# Patient Record
Sex: Male | Born: 1965 | Race: Black or African American | Hispanic: No | Marital: Married | Smoking: Current every day smoker
Health system: Southern US, Community
[De-identification: ages and names within clinical notes are randomized; demographics above are authoritative.]

## PROBLEM LIST (undated history)

## (undated) DIAGNOSIS — I10 Essential (primary) hypertension: Secondary | ICD-10-CM

## (undated) HISTORY — PX: HAND SURGERY: SHX662

---

## 2005-06-14 ENCOUNTER — Emergency Department (HOSPITAL_COMMUNITY): Admission: EM | Admit: 2005-06-14 | Discharge: 2005-06-14 | Payer: Self-pay | Admitting: Family Medicine

## 2009-03-19 ENCOUNTER — Emergency Department (HOSPITAL_COMMUNITY): Admission: EM | Admit: 2009-03-19 | Discharge: 2009-03-19 | Payer: Self-pay | Admitting: Family Medicine

## 2010-09-04 LAB — COMPREHENSIVE METABOLIC PANEL
BUN: 8 mg/dL (ref 6–23)
CO2: 29 mEq/L (ref 19–32)
Calcium: 9 mg/dL (ref 8.4–10.5)
Chloride: 101 mEq/L (ref 96–112)
Creatinine, Ser: 0.88 mg/dL (ref 0.4–1.5)
Glucose, Bld: 102 mg/dL — ABNORMAL HIGH (ref 70–99)
Potassium: 3.3 mEq/L — ABNORMAL LOW (ref 3.5–5.1)
Sodium: 138 mEq/L (ref 135–145)

## 2010-09-04 LAB — BRAIN NATRIURETIC PEPTIDE: Pro B Natriuretic peptide (BNP): <30 pg/mL (ref 0.0–100.0)

## 2013-10-04 ENCOUNTER — Emergency Department (INDEPENDENT_AMBULATORY_CARE_PROVIDER_SITE_OTHER)
Admission: EM | Admit: 2013-10-04 | Discharge: 2013-10-04 | Disposition: A | Discharge status: Home / Self Care | Payer: BC Managed Care – PPO | Source: Home / Self Care | Attending: Family Medicine | Admitting: Family Medicine

## 2013-10-04 ENCOUNTER — Encounter (HOSPITAL_COMMUNITY): Payer: Self-pay | Admitting: Emergency Medicine

## 2013-10-04 ENCOUNTER — Emergency Department (INDEPENDENT_AMBULATORY_CARE_PROVIDER_SITE_OTHER): Payer: BC Managed Care – PPO

## 2013-10-04 DIAGNOSIS — M25569 Pain in unspecified knee: Secondary | ICD-10-CM

## 2013-10-04 HISTORY — DX: Essential (primary) hypertension: I10

## 2013-10-04 MED ORDER — PREDNISONE 10 MG PO KIT: PACK | ORAL | Status: DC

## 2013-10-04 MED ORDER — TRAMADOL HCL 50 MG PO TABS: 50.0000 mg | ORAL_TABLET | Freq: Four times a day (QID) | ORAL | Status: DC | PRN

## 2013-10-04 NOTE — ED Provider Notes (Signed)
Kenneth Hart is a 48 y.o. male who presents to Urgent Care today for left knee and calf pain. Symptoms present for a few weeks but worsening over the past one or 2 days. Pain is worse with activity better with rest. No radiating pain weakness or numbness. Bowel bladder dysfunction. Pain with ambulation. No medications tried. Patient feels well otherwise. No chest pain palpitation shortness of breath or leg swelling.   Past Medical History  Diagnosis Date  . Hypertension    History  Substance Use Topics  . Smoking status: Current Every Day Smoker  . Smokeless tobacco: Not on file  . Alcohol Use: Yes   ROS as above Medications: No current facility-administered medications for this encounter.   Current Outpatient Prescriptions  Medication Sig Dispense Refill  . OVER THE COUNTER MEDICATION Blood pressure medicine      . PredniSONE 10 MG KIT 12 day dose pack po  1 kit  0  . traMADol (ULTRAM) 50 MG tablet Take 1 tablet (50 mg total) by mouth every 6 (six) hours as needed.  15 tablet  0    Exam:  BP 147/98  Pulse 100  Temp(Src) 98.5 F (36.9 C) (Oral)  Resp 20  SpO2 97% Gen: Well NAD HEENT: EOMI,  MMM Lungs: Normal work of breathing. CTABL Heart: RRR no MRG Abd: NABS, Soft. NT, ND Exts: Brisk capillary refill, warm and well perfused.  left knee: Moderate effusion. Pain with range of motion however range of motion is normal. No retropatellar crepitations on extension. Stable ligamentous exam. Negative McMurray's test.  Right knee: Normal-appearing no effusion full range of motion stable ligamentous exam.  Bilaterally extremities are nonedematous and with normal capillary refill.     No results found for this or any previous visit (from the past 24 hour(s)). Dg Knee Complete 4 Views Left  10/04/2013   CLINICAL DATA:  Right knee pain for 2 months.  No known injury.  EXAM: LEFT KNEE - COMPLETE 4+ VIEW  COMPARISON:  None.  FINDINGS: There is no fracture or dislocation. Joint spaces  are preserved. Small joint effusion is noted.  IMPRESSION: Small joint effusion.  Otherwise negative.   Electronically Signed   By: Inge Rise M.D.   On: 10/04/2013 14:53    Assessment and Plan: 48 y.o. male with pain and effusion. 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 Doubtful for DVT given no significant calf swelling. Plan to treat with tramadol and prednisone dosepak. Followup with Dr. Alfonso Ramus at [facility] not improving.  Discussed warning signs or symptoms. Please see discharge instructions. Patient expresses understanding.    Gregor Hams, MD 10/04/13 [phone]

## 2013-10-04 NOTE — Discharge Instructions (Signed)
Thank you for coming in today. Take prednisone daily for 12 days Use tramadol for pain control as needed [facility] not improving Knee Pain Knee pain can be a result of an injury or other medical conditions. Treatment will depend on the cause of your pain. HOME CARE  Only take medicine as told by your doctor.  Keep a healthy weight. Being overweight can make the knee hurt more.  Stretch before exercising or playing sports.  If there is constant knee pain, change the way you exercise. Ask your doctor for advice.  Make sure shoes fit well. Choose the right shoe for the sport or activity.  Protect your knees. Wear kneepads if needed.  Rest when you are tired. GET HELP RIGHT AWAY IF:   Your knee pain does not stop.  Your knee pain does not get better.  Your knee joint feels hot to the touch.  You have a fever. MAKE SURE YOU:   Understand these instructions.  Will watch this condition.  Will get help right away if you are not doing well or get worse. Document Released: 08/14/2008 Document Revised: 08/10/2011 Document Reviewed: 08/14/2008 [facility] Patient Information 2014 [place], [place].

## 2014-10-04 IMAGING — CR DG KNEE COMPLETE 4+V*L*
4 series · 4 of 4 positions shown · non-contrast
Comparison: None.

CLINICAL DATA: Right knee pain for 2 months.  No known injury.

EXAM:
LEFT KNEE - COMPLETE 4+ VIEW

[view not recorded (1 of 4)]
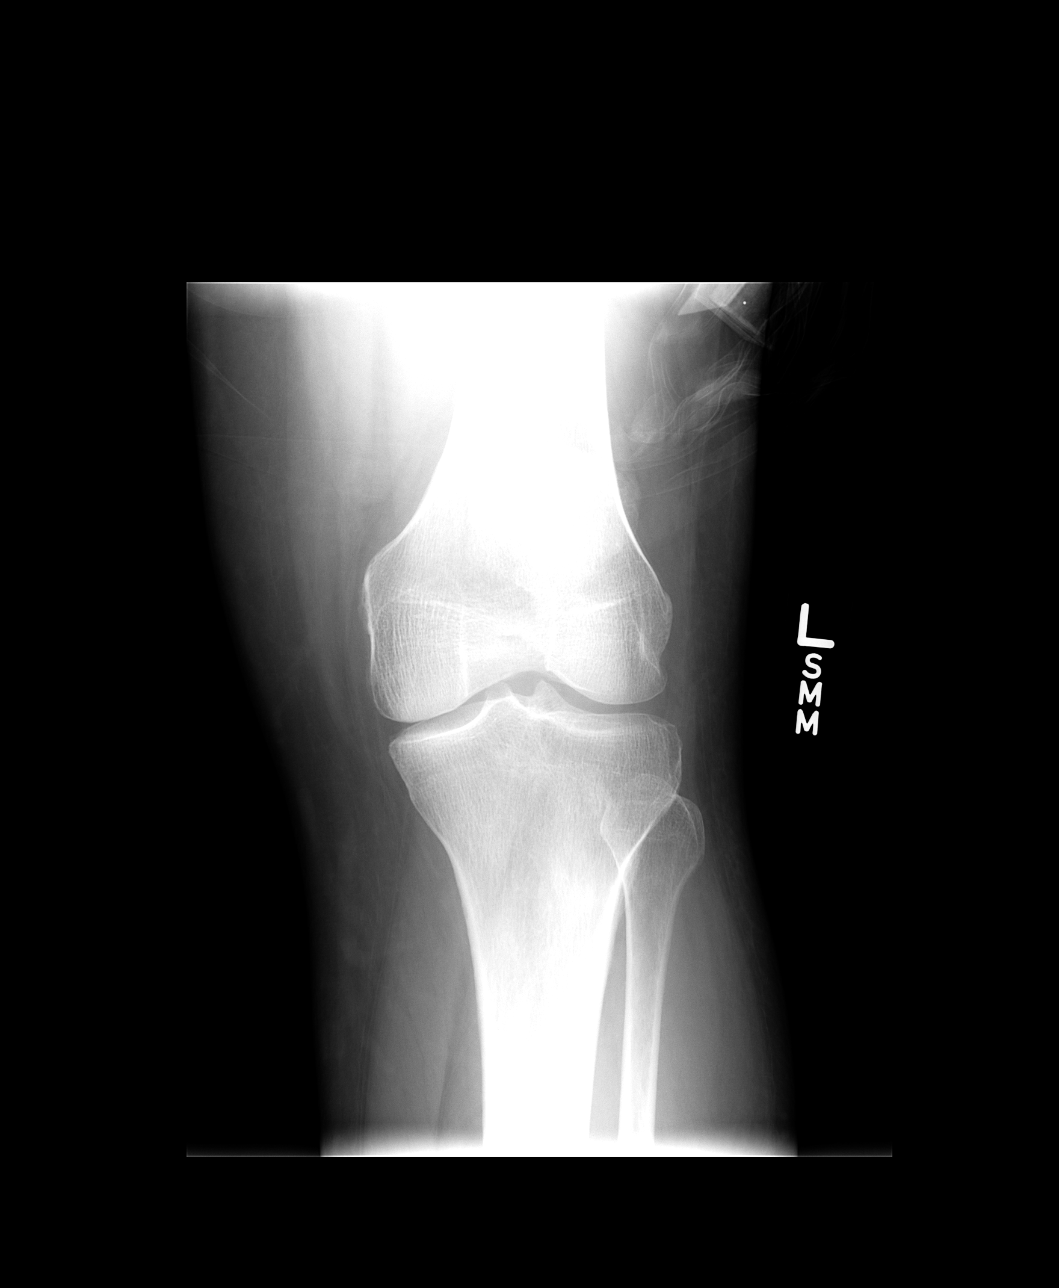

[view not recorded (2 of 4)]
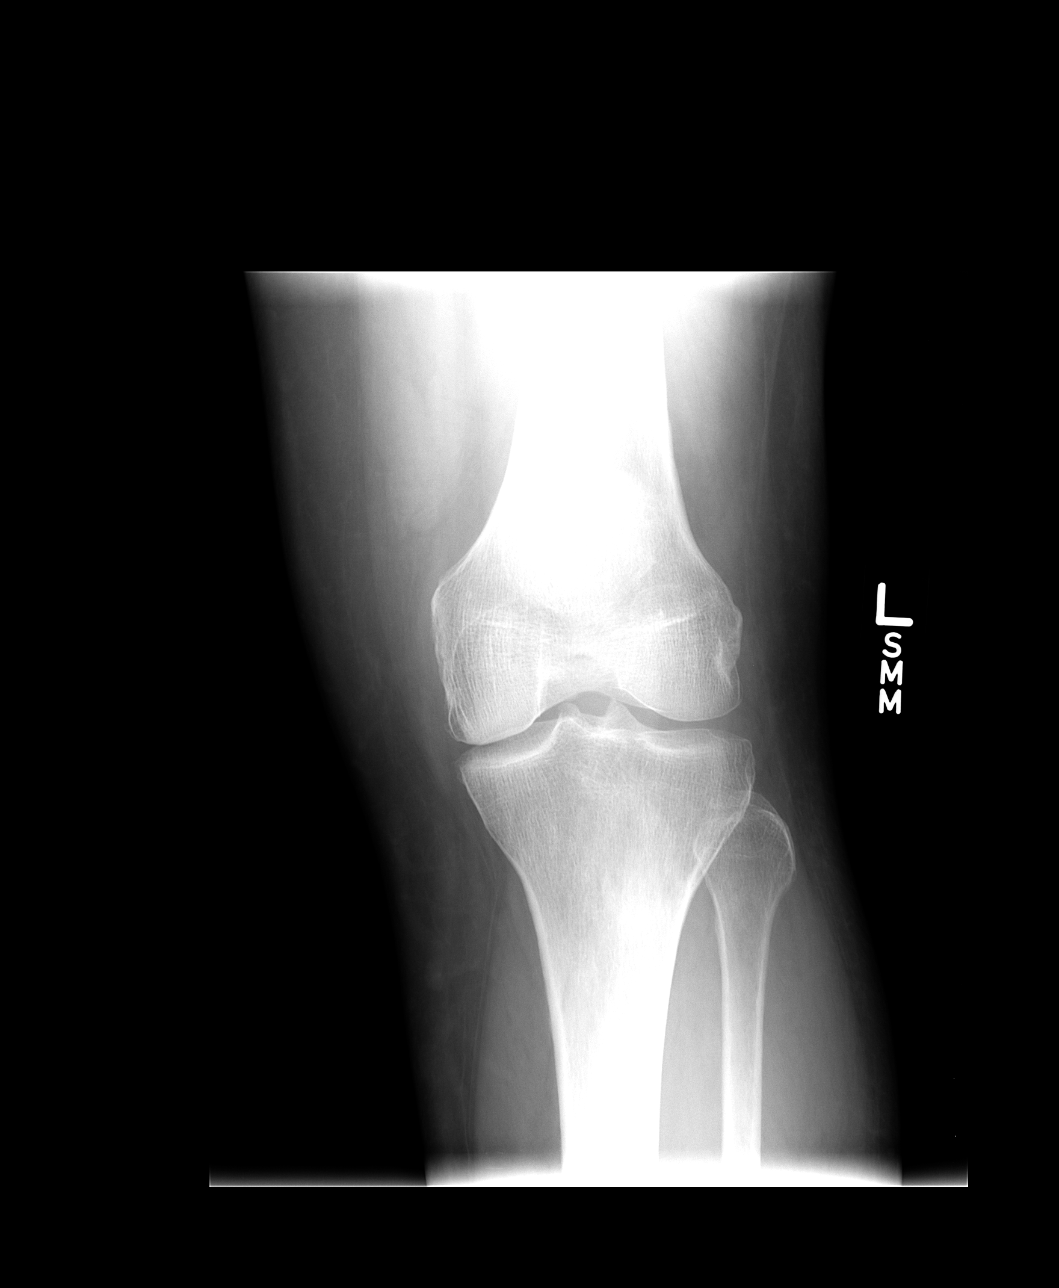

[view not recorded (3 of 4)]
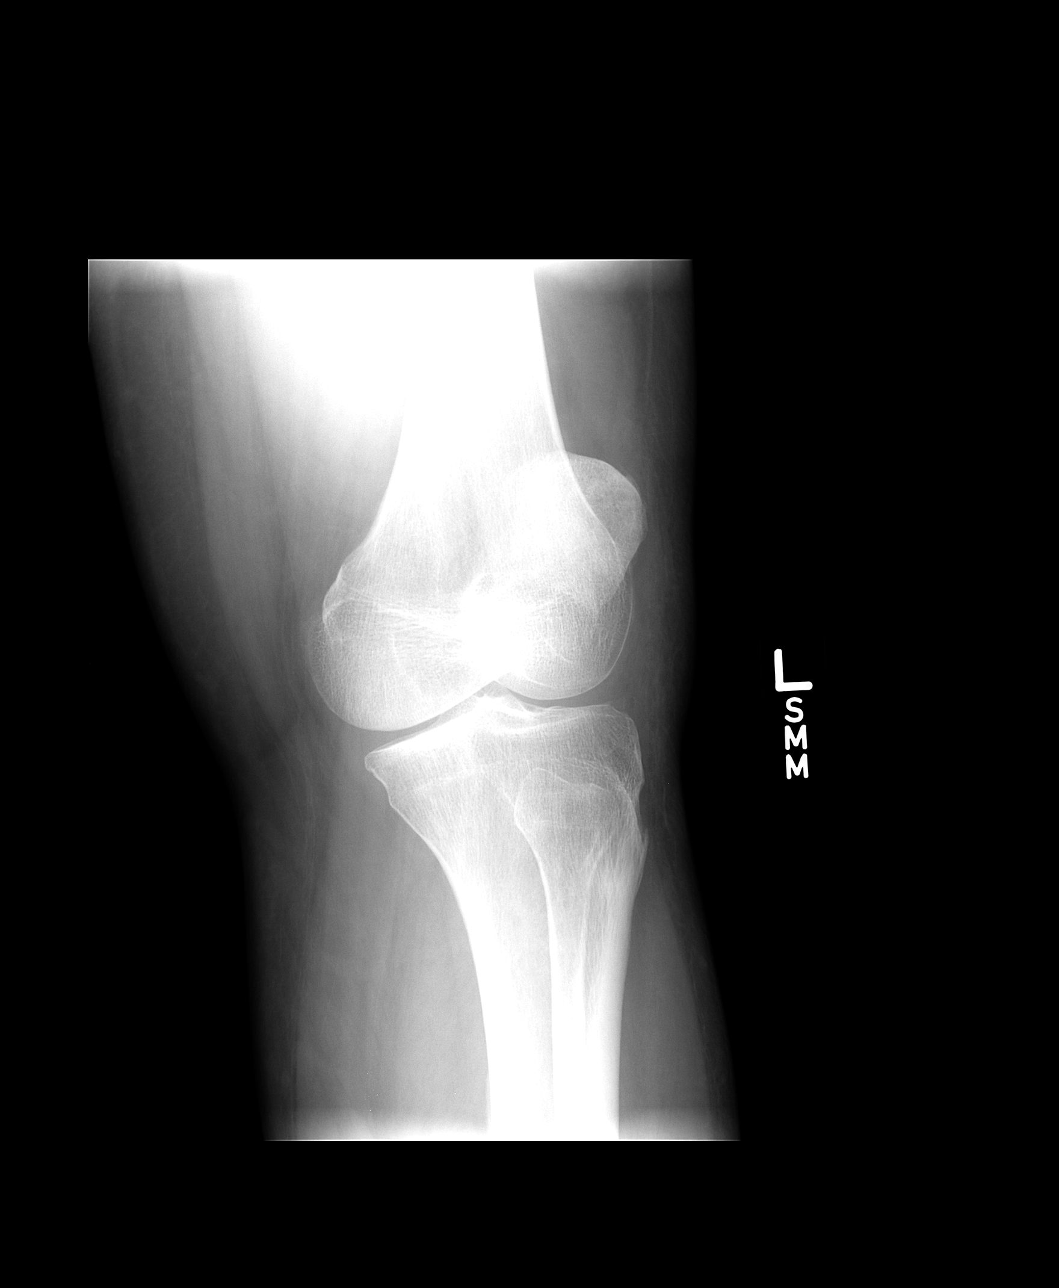

[view not recorded (4 of 4)]
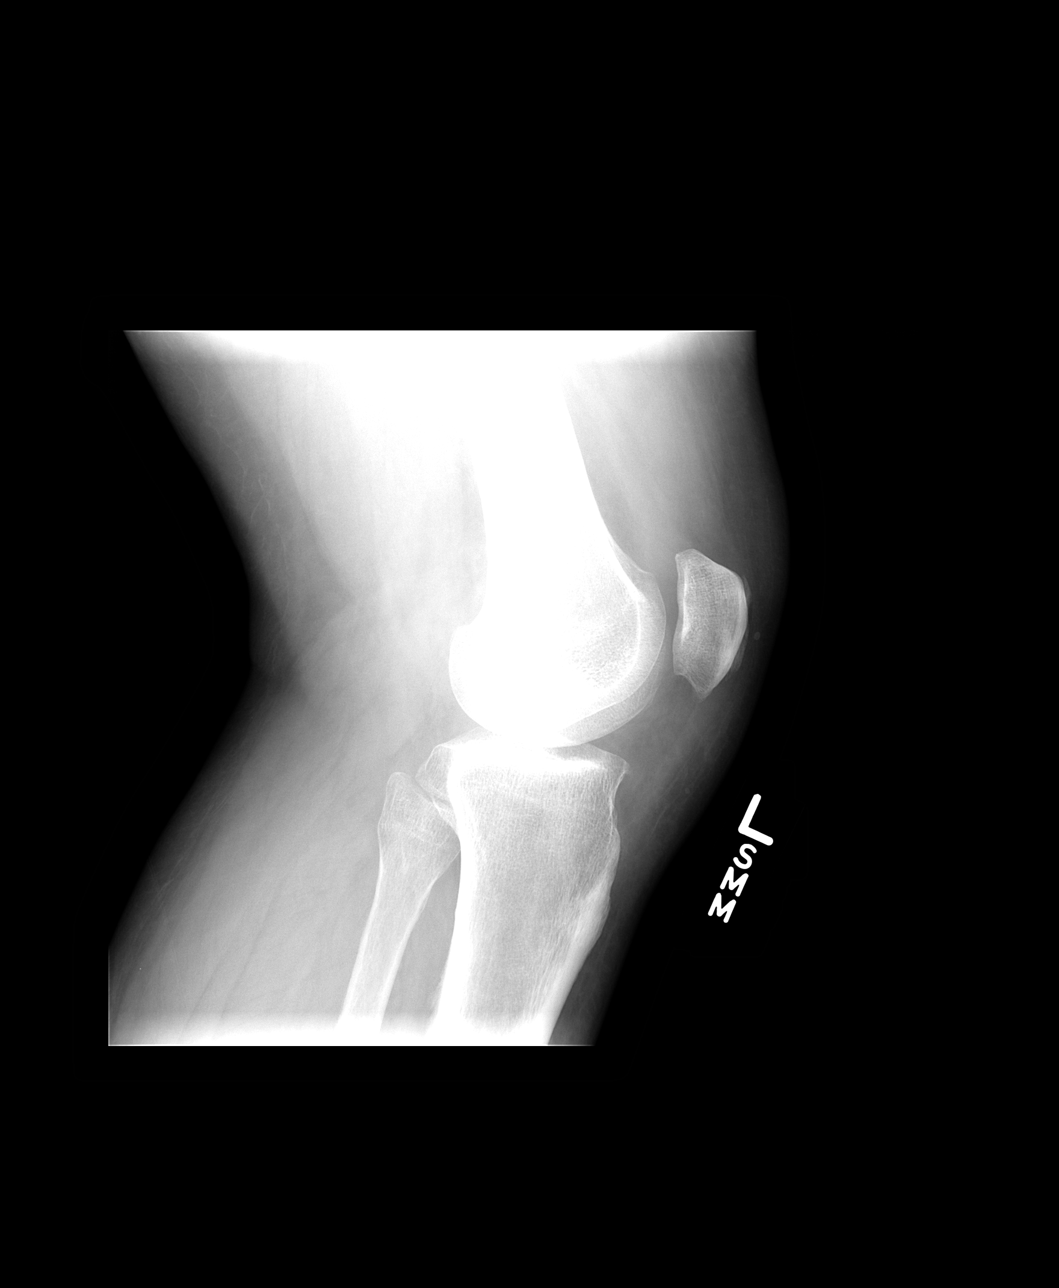

[4 of 4 positions shown; findings below may reference images not displayed]

FINDINGS: There is no fracture or dislocation. Joint spaces are preserved.
Small joint effusion is noted.
IMPRESSION: Small joint effusion.  Otherwise negative.

## 2024-04-10 ENCOUNTER — Inpatient Hospital Stay (HOSPITAL_COMMUNITY)
Admission: EM | Admit: 2024-04-10 | Discharge: 2024-04-15 | DRG: 291 | Disposition: A | Discharge status: Home / Self Care | Attending: Internal Medicine | Admitting: Internal Medicine

## 2024-04-10 ENCOUNTER — Encounter (HOSPITAL_COMMUNITY): Payer: Self-pay

## 2024-04-10 DIAGNOSIS — D649 Anemia, unspecified: Secondary | ICD-10-CM | POA: Diagnosis present

## 2024-04-10 DIAGNOSIS — I5021 Acute systolic (congestive) heart failure: Secondary | ICD-10-CM | POA: Diagnosis present

## 2024-04-10 DIAGNOSIS — E876 Hypokalemia: Secondary | ICD-10-CM | POA: Diagnosis present

## 2024-04-10 DIAGNOSIS — I5022 Chronic systolic (congestive) heart failure: Secondary | ICD-10-CM | POA: Diagnosis not present

## 2024-04-10 DIAGNOSIS — F17200 Nicotine dependence, unspecified, uncomplicated: Secondary | ICD-10-CM | POA: Diagnosis present

## 2024-04-10 DIAGNOSIS — R6881 Early satiety: Secondary | ICD-10-CM | POA: Diagnosis present

## 2024-04-10 DIAGNOSIS — R8271 Bacteriuria: Secondary | ICD-10-CM | POA: Diagnosis not present

## 2024-04-10 DIAGNOSIS — N19 Unspecified kidney failure: Secondary | ICD-10-CM | POA: Diagnosis not present

## 2024-04-10 DIAGNOSIS — E8779 Other fluid overload: Secondary | ICD-10-CM | POA: Diagnosis not present

## 2024-04-10 DIAGNOSIS — Z880 Allergy status to penicillin: Secondary | ICD-10-CM | POA: Diagnosis not present

## 2024-04-10 DIAGNOSIS — J811 Chronic pulmonary edema: Secondary | ICD-10-CM | POA: Diagnosis not present

## 2024-04-10 DIAGNOSIS — N2581 Secondary hyperparathyroidism of renal origin: Secondary | ICD-10-CM | POA: Diagnosis present

## 2024-04-10 DIAGNOSIS — Z131 Encounter for screening for diabetes mellitus: Secondary | ICD-10-CM | POA: Diagnosis not present

## 2024-04-10 DIAGNOSIS — Z89111 Acquired absence of right hand: Secondary | ICD-10-CM | POA: Diagnosis not present

## 2024-04-10 DIAGNOSIS — N390 Urinary tract infection, site not specified: Secondary | ICD-10-CM | POA: Diagnosis present

## 2024-04-10 DIAGNOSIS — I10 Essential (primary) hypertension: Secondary | ICD-10-CM | POA: Diagnosis not present

## 2024-04-10 DIAGNOSIS — I472 Ventricular tachycardia, unspecified: Secondary | ICD-10-CM | POA: Diagnosis present

## 2024-04-10 DIAGNOSIS — I509 Heart failure, unspecified: Secondary | ICD-10-CM | POA: Diagnosis not present

## 2024-04-10 DIAGNOSIS — Z79899 Other long term (current) drug therapy: Secondary | ICD-10-CM

## 2024-04-10 DIAGNOSIS — I5031 Acute diastolic (congestive) heart failure: Secondary | ICD-10-CM | POA: Diagnosis not present

## 2024-04-10 DIAGNOSIS — I132 Hypertensive heart and chronic kidney disease with heart failure and with stage 5 chronic kidney disease, or end stage renal disease: Secondary | ICD-10-CM | POA: Diagnosis present

## 2024-04-10 DIAGNOSIS — D631 Anemia in chronic kidney disease: Secondary | ICD-10-CM | POA: Diagnosis present

## 2024-04-10 DIAGNOSIS — E66812 Obesity, class 2: Secondary | ICD-10-CM | POA: Diagnosis present

## 2024-04-10 DIAGNOSIS — N185 Chronic kidney disease, stage 5: Secondary | ICD-10-CM | POA: Diagnosis present

## 2024-04-10 DIAGNOSIS — I429 Cardiomyopathy, unspecified: Secondary | ICD-10-CM | POA: Diagnosis not present

## 2024-04-10 DIAGNOSIS — M79672 Pain in left foot: Secondary | ICD-10-CM | POA: Diagnosis not present

## 2024-04-10 DIAGNOSIS — Z6838 Body mass index (BMI) 38.0-38.9, adult: Secondary | ICD-10-CM

## 2024-04-10 DIAGNOSIS — I120 Hypertensive chronic kidney disease with stage 5 chronic kidney disease or end stage renal disease: Secondary | ICD-10-CM | POA: Diagnosis not present

## 2024-04-10 DIAGNOSIS — R9431 Abnormal electrocardiogram [ECG] [EKG]: Secondary | ICD-10-CM | POA: Diagnosis not present

## 2024-04-10 DIAGNOSIS — J929 Pleural plaque without asbestos: Secondary | ICD-10-CM | POA: Diagnosis not present

## 2024-04-10 DIAGNOSIS — N179 Acute kidney failure, unspecified: Secondary | ICD-10-CM | POA: Diagnosis not present

## 2024-04-10 DIAGNOSIS — J90 Pleural effusion, not elsewhere classified: Secondary | ICD-10-CM | POA: Diagnosis not present

## 2024-04-10 DIAGNOSIS — R109 Unspecified abdominal pain: Secondary | ICD-10-CM | POA: Diagnosis not present

## 2024-04-10 LAB — CBC
HCT: 30.9 % — ABNORMAL LOW (ref 39.0–52.0)
Hemoglobin: 9 g/dL — ABNORMAL LOW (ref 13.0–17.0)
MCH: 24.7 pg — ABNORMAL LOW (ref 26.0–34.0)
MCHC: 29.1 g/dL — ABNORMAL LOW (ref 30.0–36.0)
MCV: 84.7 fL (ref 80.0–100.0)
Platelets: 336 K/uL (ref 150–400)
RBC: 3.65 MIL/uL — ABNORMAL LOW (ref 4.22–5.81)
RDW: 19.1 % — ABNORMAL HIGH (ref 11.5–15.5)
WBC: 13.6 K/uL — ABNORMAL HIGH (ref 4.0–10.5)
nRBC: 0 % (ref 0.0–0.2)

## 2024-04-10 NOTE — ED Triage Notes (Signed)
 Nurse First Note: Pt had labs done by the FNP at his job and he was told to come to the ED for abnormal kidney function.

## 2024-04-11 ENCOUNTER — Other Ambulatory Visit: Payer: Self-pay

## 2024-04-11 ENCOUNTER — Emergency Department (HOSPITAL_COMMUNITY)

## 2024-04-11 ENCOUNTER — Observation Stay (HOSPITAL_COMMUNITY)

## 2024-04-11 ENCOUNTER — Encounter (HOSPITAL_COMMUNITY): Payer: Self-pay | Admitting: Family Medicine

## 2024-04-11 DIAGNOSIS — Z880 Allergy status to penicillin: Secondary | ICD-10-CM | POA: Diagnosis not present

## 2024-04-11 DIAGNOSIS — J811 Chronic pulmonary edema: Secondary | ICD-10-CM | POA: Diagnosis not present

## 2024-04-11 DIAGNOSIS — R8271 Bacteriuria: Secondary | ICD-10-CM | POA: Diagnosis present

## 2024-04-11 DIAGNOSIS — E876 Hypokalemia: Secondary | ICD-10-CM | POA: Diagnosis present

## 2024-04-11 DIAGNOSIS — I5031 Acute diastolic (congestive) heart failure: Secondary | ICD-10-CM | POA: Diagnosis not present

## 2024-04-11 DIAGNOSIS — J929 Pleural plaque without asbestos: Secondary | ICD-10-CM | POA: Diagnosis not present

## 2024-04-11 DIAGNOSIS — R9431 Abnormal electrocardiogram [ECG] [EKG]: Secondary | ICD-10-CM | POA: Diagnosis not present

## 2024-04-11 DIAGNOSIS — J90 Pleural effusion, not elsewhere classified: Secondary | ICD-10-CM | POA: Diagnosis not present

## 2024-04-11 DIAGNOSIS — I5022 Chronic systolic (congestive) heart failure: Secondary | ICD-10-CM | POA: Diagnosis not present

## 2024-04-11 DIAGNOSIS — Z79899 Other long term (current) drug therapy: Secondary | ICD-10-CM | POA: Diagnosis not present

## 2024-04-11 DIAGNOSIS — I472 Ventricular tachycardia, unspecified: Secondary | ICD-10-CM | POA: Diagnosis present

## 2024-04-11 DIAGNOSIS — D649 Anemia, unspecified: Secondary | ICD-10-CM | POA: Diagnosis not present

## 2024-04-11 DIAGNOSIS — E66812 Obesity, class 2: Secondary | ICD-10-CM | POA: Diagnosis present

## 2024-04-11 DIAGNOSIS — N185 Chronic kidney disease, stage 5: Secondary | ICD-10-CM | POA: Diagnosis present

## 2024-04-11 DIAGNOSIS — I429 Cardiomyopathy, unspecified: Secondary | ICD-10-CM | POA: Diagnosis not present

## 2024-04-11 DIAGNOSIS — N19 Unspecified kidney failure: Secondary | ICD-10-CM | POA: Diagnosis present

## 2024-04-11 DIAGNOSIS — R109 Unspecified abdominal pain: Secondary | ICD-10-CM | POA: Diagnosis not present

## 2024-04-11 DIAGNOSIS — I5021 Acute systolic (congestive) heart failure: Secondary | ICD-10-CM

## 2024-04-11 DIAGNOSIS — N2581 Secondary hyperparathyroidism of renal origin: Secondary | ICD-10-CM | POA: Diagnosis present

## 2024-04-11 DIAGNOSIS — F17200 Nicotine dependence, unspecified, uncomplicated: Secondary | ICD-10-CM | POA: Diagnosis present

## 2024-04-11 DIAGNOSIS — N390 Urinary tract infection, site not specified: Secondary | ICD-10-CM | POA: Diagnosis present

## 2024-04-11 DIAGNOSIS — Z6838 Body mass index (BMI) 38.0-38.9, adult: Secondary | ICD-10-CM | POA: Diagnosis not present

## 2024-04-11 DIAGNOSIS — R6881 Early satiety: Secondary | ICD-10-CM | POA: Diagnosis present

## 2024-04-11 DIAGNOSIS — D631 Anemia in chronic kidney disease: Secondary | ICD-10-CM | POA: Diagnosis present

## 2024-04-11 DIAGNOSIS — I509 Heart failure, unspecified: Secondary | ICD-10-CM | POA: Diagnosis not present

## 2024-04-11 DIAGNOSIS — I132 Hypertensive heart and chronic kidney disease with heart failure and with stage 5 chronic kidney disease, or end stage renal disease: Secondary | ICD-10-CM | POA: Diagnosis present

## 2024-04-11 DIAGNOSIS — I5041 Acute combined systolic (congestive) and diastolic (congestive) heart failure: Secondary | ICD-10-CM

## 2024-04-11 DIAGNOSIS — Z89111 Acquired absence of right hand: Secondary | ICD-10-CM | POA: Diagnosis not present

## 2024-04-11 LAB — BASIC METABOLIC PANEL WITH GFR
Anion gap: 10 (ref 5–15)
BUN: 47 mg/dL — ABNORMAL HIGH (ref 6–20)
CO2: 22 mmol/L (ref 22–32)
Calcium: 4.9 mg/dL — CL (ref 8.9–10.3)
Chloride: 107 mmol/L (ref 98–111)
Creatinine, Ser: 5.63 mg/dL — ABNORMAL HIGH (ref 0.61–1.24)
GFR, Estimated: 11 mL/min — ABNORMAL LOW (ref >60)
Glucose, Bld: 88 mg/dL (ref 70–99)
Potassium: 3.4 mmol/L — ABNORMAL LOW (ref 3.5–5.1)
Sodium: 139 mmol/L (ref 135–145)

## 2024-04-11 LAB — COMPREHENSIVE METABOLIC PANEL WITH GFR
ALT: 27 U/L (ref 0–44)
AST: 20 U/L (ref 15–41)
Albumin: 2.7 g/dL — ABNORMAL LOW (ref 3.5–5.0)
Alkaline Phosphatase: 88 U/L (ref 38–126)
Anion gap: 16 — ABNORMAL HIGH (ref 5–15)
BUN: 45 mg/dL — ABNORMAL HIGH (ref 6–20)
CO2: 23 mmol/L (ref 22–32)
Calcium: 5 mg/dL — CL (ref 8.9–10.3)
Chloride: 103 mmol/L (ref 98–111)
Creatinine, Ser: 5.73 mg/dL — ABNORMAL HIGH (ref 0.61–1.24)
GFR, Estimated: 11 mL/min — ABNORMAL LOW (ref >60)
Glucose, Bld: 132 mg/dL — ABNORMAL HIGH (ref 70–99)
Potassium: 3.3 mmol/L — ABNORMAL LOW (ref 3.5–5.1)
Sodium: 142 mmol/L (ref 135–145)
Total Bilirubin: 0.2 mg/dL (ref 0.0–1.2)
Total Protein: 6.2 g/dL — ABNORMAL LOW (ref 6.5–8.1)

## 2024-04-11 LAB — ECHOCARDIOGRAM COMPLETE
Area-P 1/2: 3.85 cm2
Calc EF: 43 %
S' Lateral: 4.2 cm
Single Plane A2C EF: 41.2 %
Single Plane A4C EF: 43.5 %

## 2024-04-11 LAB — CBC
HCT: 29.3 % — ABNORMAL LOW (ref 39.0–52.0)
Hemoglobin: 8.5 g/dL — ABNORMAL LOW (ref 13.0–17.0)
MCH: 24.5 pg — ABNORMAL LOW (ref 26.0–34.0)
MCHC: 29 g/dL — ABNORMAL LOW (ref 30.0–36.0)
MCV: 84.4 fL (ref 80.0–100.0)
Platelets: 309 K/uL (ref 150–400)
RBC: 3.47 MIL/uL — ABNORMAL LOW (ref 4.22–5.81)
RDW: 19.1 % — ABNORMAL HIGH (ref 11.5–15.5)
WBC: 11 K/uL — ABNORMAL HIGH (ref 4.0–10.5)
nRBC: 0 % (ref 0.0–0.2)

## 2024-04-11 LAB — HEPATITIS C ANTIBODY: HCV Ab: NONREACTIVE

## 2024-04-11 LAB — HEMOGLOBIN A1C
Hgb A1c MFr Bld: 5.8 % — ABNORMAL HIGH (ref 4.8–5.6)
Mean Plasma Glucose: 119.76 mg/dL

## 2024-04-11 LAB — I-STAT CHEM 8, ED
BUN: 47 mg/dL — ABNORMAL HIGH (ref 6–20)
Calcium, Ion: 0.65 mmol/L — CL (ref 1.15–1.40)
Chloride: 107 mmol/L (ref 98–111)
Creatinine, Ser: 6 mg/dL — ABNORMAL HIGH (ref 0.61–1.24)
Glucose, Bld: 104 mg/dL — ABNORMAL HIGH (ref 70–99)
HCT: 31 % — ABNORMAL LOW (ref 39.0–52.0)
Hemoglobin: 10.5 g/dL — ABNORMAL LOW (ref 13.0–17.0)
Potassium: 3.3 mmol/L — ABNORMAL LOW (ref 3.5–5.1)
Sodium: 142 mmol/L (ref 135–145)
TCO2: 21 mmol/L — ABNORMAL LOW (ref 22–32)

## 2024-04-11 LAB — MAGNESIUM
Magnesium: 1.3 mg/dL — ABNORMAL LOW (ref 1.7–2.4)
Magnesium: 1.3 mg/dL — ABNORMAL LOW (ref 1.7–2.4)

## 2024-04-11 LAB — URINALYSIS, ROUTINE W REFLEX MICROSCOPIC
Bilirubin Urine: NEGATIVE
Glucose, UA: NEGATIVE mg/dL
Ketones, ur: NEGATIVE mg/dL
Nitrite: NEGATIVE
Protein, ur: 100 mg/dL — AB
Specific Gravity, Urine: 1.01 (ref 1.005–1.030)
WBC, UA: >50 WBC/hpf (ref 0–5)
pH: 5 (ref 5.0–8.0)

## 2024-04-11 LAB — CREATININE, URINE, RANDOM: Creatinine, Urine: 86 mg/dL

## 2024-04-11 LAB — PROTEIN / CREATININE RATIO, URINE
Creatinine, Urine: 86 mg/dL
Protein Creatinine Ratio: 0.95 mg/mg{creat} — ABNORMAL HIGH (ref 0.00–0.15)
Total Protein, Urine: 82 mg/dL

## 2024-04-11 LAB — FERRITIN: Ferritin: 65 ng/mL (ref 24–336)

## 2024-04-11 LAB — TSH: TSH: 1.283 u[IU]/mL (ref 0.350–4.500)

## 2024-04-11 LAB — FOLATE: Folate: 7 ng/mL (ref >5.9)

## 2024-04-11 LAB — IRON AND TIBC
Iron: 23 ug/dL — ABNORMAL LOW (ref 45–182)
Saturation Ratios: 9 % — ABNORMAL LOW (ref 17.9–39.5)
TIBC: 272 ug/dL (ref 250–450)
UIBC: 249 ug/dL

## 2024-04-11 LAB — RETICULOCYTES
Immature Retic Fract: 14.6 % (ref 2.3–15.9)
RBC.: 3.46 MIL/uL — ABNORMAL LOW (ref 4.22–5.81)
Retic Count, Absolute: 35.3 K/uL (ref 19.0–186.0)
Retic Ct Pct: 1 % (ref 0.4–3.1)

## 2024-04-11 LAB — HIV ANTIBODY (ROUTINE TESTING W REFLEX): HIV Screen 4th Generation wRfx: NONREACTIVE

## 2024-04-11 LAB — PHOSPHORUS: Phosphorus: 5.5 mg/dL — ABNORMAL HIGH (ref 2.5–4.6)

## 2024-04-11 LAB — SODIUM, URINE, RANDOM: Sodium, Ur: 34 mmol/L

## 2024-04-11 LAB — PROTEIN, URINE, RANDOM: Total Protein, Urine: 83 mg/dL

## 2024-04-11 LAB — URIC ACID: Uric Acid, Serum: 9 mg/dL — ABNORMAL HIGH (ref 3.7–8.6)

## 2024-04-11 LAB — BRAIN NATRIURETIC PEPTIDE: B Natriuretic Peptide: 1608.5 pg/mL — ABNORMAL HIGH (ref 0.0–100.0)

## 2024-04-11 LAB — CK: Total CK: 472 U/L — ABNORMAL HIGH (ref 49–397)

## 2024-04-11 LAB — VITAMIN B12: Vitamin B-12: 372 pg/mL (ref 180–914)

## 2024-04-11 LAB — HEPATITIS B SURFACE ANTIGEN: Hepatitis B Surface Ag: NONREACTIVE

## 2024-04-11 MED ORDER — POTASSIUM CHLORIDE CRYS ER 10 MEQ PO TBCR
10.0000 meq | EXTENDED_RELEASE_TABLET | Freq: Once | ORAL | Status: AC
  Administered 2024-04-11: 10 meq via ORAL
  Filled 2024-04-11: qty 1

## 2024-04-11 MED ORDER — SODIUM CHLORIDE 0.9 % IV BOLUS
500.0000 mL | Freq: Once | INTRAVENOUS | Status: AC
  Administered 2024-04-11: 500 mL via INTRAVENOUS

## 2024-04-11 MED ORDER — LACTATED RINGERS IV SOLN: INTRAVENOUS | Status: DC

## 2024-04-11 MED ORDER — SODIUM CHLORIDE 0.9 % IV SOLN
1.0000 g | Freq: Once | INTRAVENOUS | Status: AC
  Administered 2024-04-11: 1 g via INTRAVENOUS
  Filled 2024-04-11: qty 10

## 2024-04-11 MED ORDER — SENNA 8.6 MG PO TABS: 1.0000 | ORAL_TABLET | Freq: Every day | ORAL | Status: DC | PRN

## 2024-04-11 MED ORDER — SODIUM CHLORIDE 0.9 % IV SOLN: INTRAVENOUS | Status: DC

## 2024-04-11 MED ORDER — CALCIUM GLUCONATE-NACL 2-0.675 GM/100ML-% IV SOLN
2.0000 g | Freq: Once | INTRAVENOUS | Status: AC
  Administered 2024-04-11: 2000 mg via INTRAVENOUS
  Filled 2024-04-11: qty 100

## 2024-04-11 MED ORDER — IRON SUCROSE 200 MG IVPB - SIMPLE MED
200.0000 mg | Status: DC
  Administered 2024-04-11: 200 mg via INTRAVENOUS
  Filled 2024-04-11 (×2): qty 110

## 2024-04-11 MED ORDER — OXYCODONE HCL 5 MG PO TABS
5.0000 mg | ORAL_TABLET | ORAL | Status: DC | PRN
  Filled 2024-04-11: qty 1

## 2024-04-11 MED ORDER — MAGNESIUM CHLORIDE 64 MG PO TBEC
1.0000 | DELAYED_RELEASE_TABLET | Freq: Once | ORAL | Status: AC
  Administered 2024-04-11: 64 mg via ORAL
  Filled 2024-04-11: qty 1

## 2024-04-11 MED ORDER — CALCIUM GLUCONATE-NACL 1-0.675 GM/50ML-% IV SOLN
1.0000 g | Freq: Once | INTRAVENOUS | Status: AC
  Administered 2024-04-11: 1000 mg via INTRAVENOUS
  Filled 2024-04-11: qty 50

## 2024-04-11 MED ORDER — TRIMETHOBENZAMIDE HCL 100 MG/ML IM SOLN: 200.0000 mg | Freq: Four times a day (QID) | INTRAMUSCULAR | Status: DC | PRN

## 2024-04-11 MED ORDER — FENTANYL CITRATE (PF) 50 MCG/ML IJ SOSY: 12.5000 ug | PREFILLED_SYRINGE | INTRAMUSCULAR | Status: DC | PRN

## 2024-04-11 MED ORDER — SODIUM CHLORIDE 0.9% FLUSH
3.0000 mL | Freq: Two times a day (BID) | INTRAVENOUS | Status: DC
  Administered 2024-04-11 – 2024-04-15 (×10): 3 mL via INTRAVENOUS

## 2024-04-11 MED ORDER — MAGNESIUM SULFATE IN D5W 1-5 GM/100ML-% IV SOLN
1.0000 g | Freq: Once | INTRAVENOUS | Status: AC
  Administered 2024-04-11: 1 g via INTRAVENOUS
  Filled 2024-04-11: qty 100

## 2024-04-11 MED ORDER — METOPROLOL SUCCINATE ER 25 MG PO TB24
25.0000 mg | ORAL_TABLET | Freq: Every day | ORAL | Status: DC
  Administered 2024-04-11 – 2024-04-12 (×2): 25 mg via ORAL
  Filled 2024-04-11 (×2): qty 1

## 2024-04-11 MED ORDER — FUROSEMIDE 10 MG/ML IJ SOLN
80.0000 mg | Freq: Two times a day (BID) | INTRAMUSCULAR | Status: DC
  Administered 2024-04-11 – 2024-04-13 (×4): 80 mg via INTRAVENOUS
  Filled 2024-04-11 (×4): qty 8

## 2024-04-11 MED ORDER — ACETAMINOPHEN 325 MG PO TABS: 650.0000 mg | ORAL_TABLET | Freq: Four times a day (QID) | ORAL | Status: DC | PRN

## 2024-04-11 MED ORDER — ACETAMINOPHEN 650 MG RE SUPP: 650.0000 mg | Freq: Four times a day (QID) | RECTAL | Status: DC | PRN

## 2024-04-11 MED ORDER — HEPARIN SODIUM (PORCINE) 5000 UNIT/ML IJ SOLN
5000.0000 [IU] | Freq: Three times a day (TID) | INTRAMUSCULAR | Status: DC
  Administered 2024-04-11 – 2024-04-15 (×14): 5000 [IU] via SUBCUTANEOUS
  Filled 2024-04-11 (×14): qty 1

## 2024-04-11 NOTE — Consult Note (Addendum)
 Cardiology Consultation   Patient ID: Kenneth Hart MRN: [id number]; DOB: 21-Feb-1966  Admit date: 04/10/2024 Date of Consult: 04/11/2024  PCP:  Freddrick, No   [facility] Providers Cardiologist:  None        Patient Profile: Kenneth Hart is a 58 y.o. male with a hx of hypertension, though has not been seen by a medical provider in over 10 years who is being seen 04/11/2024 for the evaluation of heart failure exacerbation at the request of Concepcion Riser MD.  History of Present Illness: Mr. Mach no prior cardiac history.  Presented to the ED 11/10 at the request of his PCP at his job after he had presented for left foot pain given concern for gout routine labs were drawn and noted abnormal kidney function. BP 126/80   HR 91 ECG: sinus rhythm with TWI in leads I, aVL, II, V4-V6 VR 93 [TWI new when compared to 2010] CT chest showed evidence of heart failure with interstitial and alveolar edema and trace left pleural effusion CT renal stone showed no evidence of stone or hydronephrosis.  Third spacing and the findings above.  Echocardiogram showed LVEF 40 to 45% with RWMA [hypokinesis to the basal mid inferior septal wall and inferior wall and inferior lateral wall].  Mild concentric LVH with mild dilation to LV.  D3 DD.  Reduced RV function.  RV moderately enlarged.  Moderately dilated LA.  Pertinent lab work K3.3    Cr ~6   Ca 5.0 with calcium ionized 0.65 Magnesium 1.3     Phosphorus 5.5 BNP 1608    Albumin 2.7 CK 472 Hemoglobin ~9  Patient has received electrolyte repletion, antibiotics, and IV fluids.  Nephrology has been consulted. They have ordered IV lasix to manage volume. Patient has been advised that HD is likely.   On interview, patient confirms he has no prior cardiac history.  Denies chest pain, orthopnea, and PND.  Did note that he has been having some dyspnea on exertion/shortness of breath this week. Denied recent illness.  Has noticed BLE off  and on for years.  Drinks alcohol only occasionally, 2-3 beers at max a week Uses tobacco products, smokes 1/3 a pack a day for many years Denied illicit drug use.    Past Medical History:  Diagnosis Date   Hypertension     Past Surgical History:  Procedure Laterality Date   HAND SURGERY     amputated and reattached       Scheduled Meds:  furosemide  80 mg Intravenous BID   heparin  5,000 Units Subcutaneous Q8H   sodium chloride flush  3 mL Intravenous Q12H   Continuous Infusions:  iron sucrose     PRN Meds: acetaminophen **OR** acetaminophen, fentaNYL (SUBLIMAZE) injection, oxyCODONE, senna, trimethobenzamide  Allergies:    Allergies  Allergen Reactions   Amoxicillin Hives    Social History:   Social History   Socioeconomic History   Marital status: Married    Spouse name: Not on file   Number of children: Not on file   Years of education: Not on file   Highest education level: Not on file  Occupational History   Not on file  Tobacco Use   Smoking status: Every Day   Smokeless tobacco: Not on file  Substance and Sexual Activity   Alcohol use: Yes   Drug use: Not on file   Sexual activity: Not on file  Other Topics Concern   Not on file  Social History Narrative  Not on file   Social Drivers of Health   Financial Resource Strain: Not on file  Food Insecurity: Not on file  Transportation Needs: Not on file  Physical Activity: Not on file  Stress: Not on file  Social Connections: Not on file  Intimate Partner Violence: Not on file    Family History:   History reviewed. No pertinent family history.   ROS:  Please see the history of present illness.  All other ROS reviewed and negative.     Physical Exam/Data: Vitals:   04/11/24 0800 04/11/24 0905 04/11/24 1000 04/11/24 1346  BP: (!) 146/88  (!) 137/92 (!) 167/104  Pulse: 96 86 96 97  Resp: 10 14 (!) 22 18  Temp:  98.7 F (37.1 C)  98.7 F (37.1 C)  TempSrc:  Oral  Oral  SpO2: 97% 96%  95% 98%    Intake/Output Summary (Last 24 hours) at 04/11/2024 1448 Last data filed at 04/11/2024 0730 Gross per 24 hour  Intake 550 ml  Output 320 ml  Net 230 ml       No data to display           There is no height or weight on file to calculate BMI.  General:  Well nourished, well developed, in no acute distress HEENT: normal Neck: unable to assess JVD Vascular: left radial pulses 2+ , right difficult to assess though known trauma hand surgery hx Cardiac:  normal S1, S2; RRR; no murmur  Lungs:  Bibasilar crackles and occasional wheezing Ext: 1+ pitting edema Musculoskeletal:  No deformities, BUE and BLE strength normal and equal Skin: warm and dry  Neuro:  CNs 2-12 intact, no focal abnormalities noted Psych:  Normal affect   EKG:  The EKG was personally reviewed and demonstrates:  See hpi Telemetry:  Telemetry was personally reviewed and demonstrates:  sinus rhythm HR ~90, one episode of slow VT around 10:30 am today  Laboratory Data:  Chemistry Recent Labs  Lab 04/10/24 2305 04/11/24 0058 04/11/24 0106 04/11/24 0350  NA 142  --  142 139  K 3.3*  --  3.3* 3.4*  CL 103  --  107 107  CO2 23  --   --  22  GLUCOSE 132*  --  104* 88  BUN 45*  --  47* 47*  CREATININE 5.73*  --  6.00* 5.63*  CALCIUM 5.0*  --   --  4.9*  MG  --  1.3*  --  1.3*  GFRNONAA 11*  --   --  11*  ANIONGAP 16*  --   --  10    Recent Labs  Lab 04/10/24 2305  PROT 6.2*  ALBUMIN 2.7*  AST 20  ALT 27  ALKPHOS 88  BILITOT 0.2   Hematology Recent Labs  Lab 04/10/24 2305 04/11/24 0106 04/11/24 0350  WBC 13.6*  --  11.0*  RBC 3.65*  --  3.47*  3.46*  HGB 9.0* 10.5* 8.5*  HCT 30.9* 31.0* 29.3*  MCV 84.7  --  84.4  MCH 24.7*  --  24.5*  MCHC 29.1*  --  29.0*  RDW 19.1*  --  19.1*  PLT 336  --  309   BNP Recent Labs  Lab 04/11/24 0058  BNP 1,608.5*     Radiology/Studies:  ECHOCARDIOGRAM COMPLETE Result Date: 04/11/2024    ECHOCARDIOGRAM REPORT   Patient Name:   Kenneth Hart Date of Exam: 04/11/2024 Medical Rec #:  [id number]  Height:       67.0 in Accession #:    [id number]       Weight:       265.0 lb Date of Birth:  10-08-1965        BSA:          2.279 m Patient Age:    58 years         BP:           146/88 mmHg Patient Gender: M                HR:           88 bpm. Exam Location:  Inpatient Procedure: 2D Echo, Cardiac Doppler and Color Doppler (Both Spectral and Color            Flow Doppler were utilized during procedure). Indications:    I50.31 Acute diastolic (congestive) heart failure  History:        Patient has no prior history of Echocardiogram examinations.                 Risk Factors:Hypertension.  Sonographer:    Damien Senior RDCS Referring Phys: [id number] TIMOTHY S OPYD IMPRESSIONS  1. Left ventricular ejection fraction, by estimation, is 40 to 45%. Left ventricular ejection fraction by 2D MOD biplane is 43.0 %. The left ventricle has mildly decreased function. The left ventricle demonstrates regional wall motion abnormalities (see  scoring diagram/findings for description). The left ventricular internal cavity size was mildly dilated. There is mild concentric left ventricular hypertrophy. Left ventricular diastolic parameters are consistent with Grade III diastolic dysfunction (restrictive). Elevated left atrial pressure. There is moderate hypokinesis of the left ventricular, basal-mid inferoseptal wall, inferior wall and inferolateral wall.  2. Right ventricular systolic function is mildly reduced. The right ventricular size is moderately enlarged. Tricuspid regurgitation signal is inadequate for assessing PA pressure.  3. Left atrial size was moderately dilated.  4. The mitral valve is normal in structure. Trivial mitral valve regurgitation.  5. The aortic valve is tricuspid. Aortic valve regurgitation is not visualized.  6. The inferior vena cava is dilated in size with <50% respiratory variability, suggesting right atrial pressure of 15 mmHg. FINDINGS   Left Ventricle: Left ventricular ejection fraction, by estimation, is 40 to 45%. Left ventricular ejection fraction by 2D MOD biplane is 43.0 %. The left ventricle has mildly decreased function. The left ventricle demonstrates regional wall motion abnormalities. Moderate hypokinesis of the left ventricular, basal-mid inferoseptal wall, inferior wall and inferolateral wall. The left ventricular internal cavity size was mildly dilated. There is mild concentric left ventricular hypertrophy. Left ventricular diastolic parameters are consistent with Grade III diastolic dysfunction (restrictive). Elevated left atrial pressure. Right Ventricle: The right ventricular size is moderately enlarged. No increase in right ventricular wall thickness. Right ventricular systolic function is mildly reduced. Tricuspid regurgitation signal is inadequate for assessing PA pressure. Left Atrium: Left atrial size was moderately dilated. Right Atrium: Right atrial size was normal in size. Pericardium: There is no evidence of pericardial effusion. Mitral Valve: The mitral valve is normal in structure. Trivial mitral valve regurgitation. Tricuspid Valve: The tricuspid valve is normal in structure. Tricuspid valve regurgitation is trivial. Aortic Valve: The aortic valve is tricuspid. Aortic valve regurgitation is not visualized. Pulmonic Valve: The pulmonic valve was grossly normal. Pulmonic valve regurgitation is not visualized. Aorta: The aortic root and ascending aorta are structurally normal, with no evidence of dilitation. Venous: The inferior vena cava is dilated in size  with less than 50% respiratory variability, suggesting right atrial pressure of 15 mmHg. IAS/Shunts: No atrial level shunt detected by color flow Doppler.  LEFT VENTRICLE PLAX 2D                        Biplane EF (MOD) LVIDd:         5.80 cm         LV Biplane EF:   Left LVIDs:         4.20 cm                          ventricular LV PW:         1.20 cm                           ejection LV IVS:        1.40 cm                          fraction by LVOT diam:     2.20 cm                          2D MOD LV SV:         81                               biplane is LV SV Index:   36                               43.0 %. LVOT Area:     3.80 cm LV IVRT:       87 msec         Diastology                                LV e' medial:    4.90 cm/s                                LV E/e' medial:  25.9 LV Volumes (MOD)               LV e' lateral:   4.24 cm/s LV vol d, MOD    149.0 ml      LV E/e' lateral: 30.0 A2C: LV vol d, MOD    170.0 ml A4C: LV vol s, MOD    87.6 ml A2C: LV vol s, MOD    96.0 ml A4C: LV SV MOD A2C:   61.4 ml LV SV MOD A4C:   170.0 ml LV SV MOD BP:    71.0 ml RIGHT VENTRICLE RV S prime:     10.90 cm/s TAPSE (M-mode): 1.8 cm LEFT ATRIUM              Index        RIGHT ATRIUM           Index LA diam:        6.00 cm  2.63 cm/m   RA Area:     23.30 cm LA Vol (A2C):   84.4 ml  37.03 ml/m  RA Volume:   76.90  ml  33.74 ml/m LA Vol (A4C):   109.0 ml 47.83 ml/m LA Biplane Vol: 97.1 ml  42.61 ml/m  AORTIC VALVE LVOT Vmax:   107.00 cm/s LVOT Vmean:  79.600 cm/s LVOT VTI:    0.214 m  AORTA Ao Root diam: 3.30 cm Ao Asc diam:  3.20 cm MITRAL VALVE MV Area (PHT): 3.85 cm     SHUNTS MV Decel Time: 197 msec     Systemic VTI:  0.21 m MV E velocity: 127.00 cm/s  Systemic Diam: 2.20 cm MV A velocity: 48.40 cm/s MV E/A ratio:  2.62 Mihai Croitoru MD Electronically signed by Jerel Balding MD Signature Date/Time: 04/11/2024/9:18:51 AM    Final    CT Chest Wo Contrast Result Date: 04/11/2024 EXAM: CT CHEST WITHOUT CONTRAST 04/11/2024 01:50:27 AM TECHNIQUE: CT of the chest was performed without the administration of intravenous contrast. Multiplanar reformatted images are provided for review. Automated exposure control, iterative reconstruction, and/or weight based adjustment of the mA/kV was utilized to reduce the radiation dose to as low as reasonably achievable. COMPARISON: None available.  CLINICAL HISTORY: Respiratory illness, nondiagnostic xray. FINDINGS: MEDIASTINUM: Cardiomegaly. Low-density blood pool compatible with anemia. Pericardium is unremarkable. The central airways are clear. LYMPH NODES: Shotty mediastinal lymph nodes likely reactive. Evaluation of hilar lymph nodes is limited without IV contrast. No axillary lymphadenopathy. LUNGS AND PLEURA: Diffuse interlobular septal thickening and bronchial wall thickening. Peribronchovascular patchy ground glass opacities. Small right and trace left pleural effusions. No pneumothorax. SOFT TISSUES/BONES: No acute abnormality of the bones or soft tissues. UPPER ABDOMEN: Limited images of the upper abdomen demonstrates no acute abnormality. IMPRESSION: 1. CHF with interstitial and alveolar edema. Small right and trace left pleural effusion. Electronically signed by: [name] MD 04/11/2024 02:00 AM EST RP Workstation: [id number]   CT Renal Stone Study Result Date: 04/11/2024 EXAM: CT UROGRAM 04/11/2024 01:50:27 AM TECHNIQUE: CT of the abdomen and pelvis was performed without the administration of intravenous contrast as per CT urogram protocol. Multiplanar reformatted images as well as MIP urogram images are provided for review. Automated exposure control, iterative reconstruction, and/or weight based adjustment of the mA/kV was utilized to reduce the radiation dose to as low as reasonably achievable. COMPARISON: None available. CLINICAL HISTORY: Abdominal/flank pain, stone suspected. FINDINGS: LOWER CHEST: Small right pleural effusion. Interlobular septal thickening and patchy ground-glass opacities in the lower lung suspicious for edema. Cardiomegaly. LIVER: The liver is unremarkable. GALLBLADDER AND BILE DUCTS: Gallbladder is unremarkable. No biliary ductal dilatation. SPLEEN: No acute abnormality. PANCREAS: No acute abnormality. ADRENAL GLANDS: No acute abnormality. KIDNEYS, URETERS AND BLADDER: No stones in the kidneys or ureters. No  hydronephrosis. Nonspecific symmetric perinephric stranding. Urinary bladder is unremarkable. GI AND BOWEL: Stomach demonstrates no acute abnormality. There is no bowel obstruction. Normal appendix. PERITONEUM AND RETROPERITONEUM: No ascites. No free air. Mesenteric edema. VASCULATURE: Aorta is normal in caliber. LYMPH NODES: No lymphadenopathy. REPRODUCTIVE ORGANS: No acute abnormality. BONES AND SOFT TISSUES: No acute osseous abnormality. Body wall anasarca. IMPRESSION: 1. Findings suggest congestive heart failure with 3rd spacing of fluid including pulmonary, mesenteric, and body wall edema and small right pleural effusion. 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  Imaging concerning for volume overload.  Echo shows LVEF 40-45% with RWMA and G3 DD. RV with moderate enlargement and  mildly reduced function.   With risk factors of uncontrolled hypertension and tobacco use along with RWMA on echo and TWI on ECG would like pursue ischemic evaluation eventually, however with patient's current kidney function will avoid IV contrast and no urgent indication given lack of chest pain. IF patient goes on HD this admission will re-consider cardiac catheterization this admission. Can consider other modalities for ischemic evaluation later this admission as well. At this time etiology is still unknown. Denied palpitations though on telemetry did have a run of slow VT [most likely from electrolyte disturbances which K historically has been low]. Denied excessive alcohol use and denied illicit drug use. No recent illness.  TSH pending.   Volume management per nephrology Start metoprolol succinate 25 mg  Consider starting bi-dil tomorrow if BP allows MRA/ARB/ACEi/ARNI/SGLT2i deferred  2/2 renal dysfunction  Ventricular Tachycardia One episode of VT on telemetry. Most likely 2/2 significant electrolyte disturbances. Nephrology consulted and appreciate recommendations.  Will continue to monitor BB as above  Hypertension BP: 167/104- though on arrival patient was normotensive not on medications GDMT as above  Will check lipid panel for am A1c 5.9 % from outpatient labs  Per primary CKD Electrolyte disturbances Foot pain  Risk Assessment/Risk Scores:       [place]  Heart Association (NYHA) Functional Class NYHA Class II   For questions or updates, please contact [facility] Please consult [email or web address] for contact info under      Signed, Leontine LOISE Salen, PA-C  04/11/2024 2:48 PM

## 2024-04-11 NOTE — ED Notes (Signed)
 Pt requested coffee, verified with MD, okay to give.

## 2024-04-11 NOTE — ED Notes (Signed)
 Output in urinal. Emptied and clean

## 2024-04-11 NOTE — Progress Notes (Signed)
 Echocardiogram 2D Echocardiogram has been performed.  Kenneth Hart [name] RDCS 04/11/2024, 8:33 AM

## 2024-04-11 NOTE — Progress Notes (Signed)
 Courtesy visit- No billing  Patient is seen and examined today morning. He is admitted for evaluation of renal failure. Patient presented with nausea, early satiety, leg edema, and shortness of breath with exertion. He is noted to have renal failure, electrolyte abnormalities.  Plan to continue electrolyte repletion. He got IV calcium, mag, oral kcl. Nephrology consulted. This seems more CKD picture given undiagnosed hypertension, CHF. Echo showed EF 40%, grade 3 DD, Cardiology consulted. Further management per clinic course.

## 2024-04-11 NOTE — ED Notes (Signed)
 Phlebotomy is going to come and get the blood work.

## 2024-04-11 NOTE — ED Provider Notes (Signed)
 [place] EMERGENCY DEPARTMENT AT [facility] Provider Note   CSN: [id number] Arrival date & time: 04/10/24  2247     Patient presents with: No chief complaint on file.   Kenneth Hart Payer is a 58 y.o. male.   HPI Patient presents for abnormal outpatient lab work.  Medical history includes hypertension.  He does not take any daily medications.  He has recently had intermittent pain in left foot.  He was seen in doctor's office earlier today for his left foot pain.  There was concern of gout.  He was placed in cam boot and outpatient lab work was done.  He was called to inform them that his kidney function was abnormal.  Patient reports that he has recently been having exertional nausea.  He will feel mild shortness of breath at times.  He has not had any difficulty urinating.  He has had some urinary frequency.  He denies any other recent symptoms.  Currently, his left foot is pain-free.    Prior to Admission medications   Medication Sig Start Date End Date Taking? Authorizing Provider  OVER THE COUNTER MEDICATION Blood pressure medicine    [provider]  PredniSONE  10 MG KIT 12 day dose pack po 10/04/13   Corey, Evan S, MD  traMADol  (ULTRAM ) 50 MG tablet Take 1 tablet (50 mg total) by mouth every 6 (six) hours as needed. 10/04/13   Corey, Evan S, MD    Allergies: Patient has no known allergies.    Review of Systems  Gastrointestinal:  Positive for nausea.  Genitourinary:  Positive for frequency.  Musculoskeletal:  Positive for arthralgias.  All other systems reviewed and are negative.   Updated Vital Signs BP 131/85   Pulse 91   Temp 98.3 F (36.8 C)   Resp 19   SpO2 96%   Physical Exam Vitals and nursing note reviewed.  Constitutional:      General: He is not in acute distress.    Appearance: Normal appearance. He is well-developed. He is not ill-appearing, toxic-appearing or diaphoretic.  HENT:     Head: Normocephalic and atraumatic.     Right Ear:  External ear normal.     Left Ear: External ear normal.     Nose: Nose normal.     Mouth/Throat:     Mouth: Mucous membranes are moist.  Eyes:     Extraocular Movements: Extraocular movements intact.     Conjunctiva/sclera: Conjunctivae normal.  Cardiovascular:     Rate and Rhythm: Normal rate and regular rhythm.  Pulmonary:     Effort: Pulmonary effort is normal. No respiratory distress.  Abdominal:     General: There is no distension.     Palpations: Abdomen is soft.     Tenderness: There is no abdominal tenderness.  Musculoskeletal:        General: Normal range of motion.     Cervical back: Normal range of motion and neck supple.     Comments: Cam boot in place on left foot  Skin:    General: Skin is warm and dry.     Coloration: Skin is not jaundiced or pale.  Neurological:     General: No focal deficit present.     Mental Status: He is alert and oriented to person, place, and time.  Psychiatric:        Mood and Affect: Mood normal.        Behavior: Behavior normal.     (all labs ordered are listed,  but only abnormal results are displayed) Labs Reviewed  COMPREHENSIVE METABOLIC PANEL WITH GFR - Abnormal; Notable for the following components:      Result Value   Potassium 3.3 (*)    Glucose, Bld 132 (*)    BUN 45 (*)    Creatinine, Ser 5.73 (*)    Calcium 5.0 (*)    Total Protein 6.2 (*)    Albumin 2.7 (*)    GFR, Estimated 11 (*)    Anion gap 16 (*)    All other components within normal limits  CBC - Abnormal; Notable for the following components:   WBC 13.6 (*)    RBC 3.65 (*)    Hemoglobin 9.0 (*)    HCT 30.9 (*)    MCH 24.7 (*)    MCHC 29.1 (*)    RDW 19.1 (*)    All other components within normal limits  URINALYSIS, ROUTINE W REFLEX MICROSCOPIC - Abnormal; Notable for the following components:   APPearance HAZY (*)    Hgb urine dipstick SMALL (*)    Protein, ur 100 (*)    Leukocytes,Ua LARGE (*)    Bacteria, UA MANY (*)    All other components  within normal limits  I-STAT CHEM 8, ED - Abnormal; Notable for the following components:   Potassium 3.3 (*)    BUN 47 (*)    Creatinine, Ser 6.00 (*)    Glucose, Bld 104 (*)    Calcium, Ion 0.65 (*)    TCO2 21 (*)    Hemoglobin 10.5 (*)    HCT 31.0 (*)    All other components within normal limits  URINE CULTURE  BRAIN NATRIURETIC PEPTIDE  MAGNESIUM    EKG: EKG Interpretation Date/Time:  Tuesday April 11 2024 01:16:05 EST Ventricular Rate:  93 PR Interval:  145 QRS Duration:  100 QT Interval:  408 QTC Calculation: 508 R Axis:   38  Text Interpretation: Sinus rhythm Abnormal T, consider ischemia, diffuse leads Prolonged QT interval Confirmed by Melvenia Motto [phone]) on 04/11/2024 1:28:43 AM  Radiology: CT Chest Wo Contrast Result Date: 04/11/2024 EXAM: CT CHEST WITHOUT CONTRAST 04/11/2024 01:50:27 AM TECHNIQUE: CT of the chest was performed without the administration of intravenous contrast. Multiplanar reformatted images are provided for review. Automated exposure control, iterative reconstruction, and/or weight based adjustment of the mA/kV was utilized to reduce the radiation dose to as low as reasonably achievable. COMPARISON: None available. CLINICAL HISTORY: Respiratory illness, nondiagnostic xray. FINDINGS: MEDIASTINUM: Cardiomegaly. Low-density blood pool compatible with anemia. Pericardium is unremarkable. The central airways are clear. LYMPH NODES: Shotty mediastinal lymph nodes likely reactive. Evaluation of hilar lymph nodes is limited without IV contrast. No axillary lymphadenopathy. LUNGS AND PLEURA: Diffuse interlobular septal thickening and bronchial wall thickening. Peribronchovascular patchy ground glass opacities. Small right and trace left pleural effusions. No pneumothorax. SOFT TISSUES/BONES: No acute abnormality of the bones or soft tissues. UPPER ABDOMEN: Limited images of the upper abdomen demonstrates no acute abnormality. IMPRESSION: 1. CHF with interstitial  and alveolar edema. Small right and trace left pleural effusion. Electronically signed by: [name] MD 04/11/2024 02:00 AM EST RP Workstation: [id number]   CT Renal Stone Study Result Date: 04/11/2024 EXAM: CT UROGRAM 04/11/2024 01:50:27 AM TECHNIQUE: CT of the abdomen and pelvis was performed without the administration of intravenous contrast as per CT urogram protocol. Multiplanar reformatted images as well as MIP urogram images are provided for review. Automated exposure control, iterative reconstruction, and/or weight based adjustment of the mA/kV was utilized to  reduce the radiation dose to as low as reasonably achievable. COMPARISON: None available. CLINICAL HISTORY: Abdominal/flank pain, stone suspected. FINDINGS: LOWER CHEST: Small right pleural effusion. Interlobular septal thickening and patchy ground-glass opacities in the lower lung suspicious for edema. Cardiomegaly. LIVER: The liver is unremarkable. GALLBLADDER AND BILE DUCTS: Gallbladder is unremarkable. No biliary ductal dilatation. SPLEEN: No acute abnormality. PANCREAS: No acute abnormality. ADRENAL GLANDS: No acute abnormality. KIDNEYS, URETERS AND BLADDER: No stones in the kidneys or ureters. No hydronephrosis. Nonspecific symmetric perinephric stranding. Urinary bladder is unremarkable. GI AND BOWEL: Stomach demonstrates no acute abnormality. There is no bowel obstruction. Normal appendix. PERITONEUM AND RETROPERITONEUM: No ascites. No free air. Mesenteric edema. VASCULATURE: Aorta is normal in caliber. LYMPH NODES: No lymphadenopathy. REPRODUCTIVE ORGANS: No acute abnormality. BONES AND SOFT TISSUES: No acute osseous abnormality. Body wall anasarca. IMPRESSION: 1. Findings suggest congestive heart failure with 3rd spacing of fluid including pulmonary, mesenteric, and body wall edema and small right pleural effusion. Electronically signed by: [name] MD 04/11/2024 01:57 AM EST RP Workstation: [id number]     Procedures    Medications Ordered in the ED  calcium gluconate 1 g/ 50 mL sodium chloride IVPB (has no administration in time range)  cefTRIAXone (ROCEPHIN) 1 g in sodium chloride 0.9 % 100 mL IVPB (1 g Intravenous New Bag/Given 04/11/24 0158)  sodium chloride 0.9 % bolus 500 mL (500 mLs Intravenous New Bag/Given 04/11/24 0157)                                    Medical Decision Making Amount and/or Complexity of Data Reviewed Labs: ordered. Radiology: ordered.  Risk Prescription drug management. Decision regarding hospitalization.   This patient presents to the ED for concern of abnormal outpatient lab work, this involves an extensive number of treatment options, and is a complaint that carries with it a high risk of complications and morbidity.  The differential diagnosis includes undiagnosed medical condition, acute renal failure, laboratory error   Co morbidities / Chronic conditions that complicate the patient evaluation  HTN   Additional history obtained:  Additional history obtained from EMR External records from outside source obtained and reviewed including N/A   Lab Tests:  I Ordered, and personally interpreted labs.  The pertinent results include: Renal failure, azotemia, hypocalcemia and hypokalemia, anemia of unknown chronicity, leukocytosis, UTI   Imaging Studies ordered:  I ordered imaging studies including CT of chest, abdomen, pelvis I independently visualized and interpreted imaging which showed findings suggestive of CHF.  No obstructive uropathy I agree with the radiologist interpretation   Cardiac Monitoring: / EKG:  The patient was maintained on a cardiac monitor.  I personally viewed and interpreted the cardiac monitored which showed an underlying rhythm of: Sinus rhythm   Problem List / ED Course / Critical interventions / Medication management  Patient presenting for abnormal outpatient lab work.  This lab work was done earlier today.  He was called  to inform that his kidney function was abnormal.  Per chart review, last previous lab work was 15 years ago.  His kidney function was fine at that time.  He describes some recent exertional nausea.  Prior to patient being bedded in the ED, laboratory workup was initiated.  CMP confirms renal failure.  Acuity of this is indeterminate.  He has mild hypokalemia and notable hypocalcemia.  I-STAT was obtained which shows low ionized calcium as well.  Dose of calcium  gluconate was ordered.  His lab work is also notable for leukocytosis and evidence of UTI.  Ceftriaxone was ordered.  CT imaging showed findings suggestive of CHF.  IV fluids were discontinued.  BNP was ordered.  I spoke with nephrologist on-call, Dr. Gearline, who will see in consult.  Patient was admitted for further management. I ordered medication including IV fluids for hydration, ceftriaxone for UTI, calcium gluconate for hypocalcemia Reevaluation of the patient after these medicines showed that the patient stayed the same I have reviewed the patients home medicines and have made adjustments as needed   Consultations Obtained:  I requested consultation with the nephrologist, Dr. Gearline,  and discussed lab and imaging findings as well as pertinent plan - they recommend: Will see in consult   Social Determinants of Health:  Lives at home with     Final diagnoses:  Renal failure, unspecified chronicity  Hypocalcemia    ED Discharge Orders     None          Melvenia Motto, MD 04/11/24 [phone]

## 2024-04-11 NOTE — ED Notes (Signed)
Transport taking pt to room.

## 2024-04-11 NOTE — ED Notes (Signed)
 PT resting in bed at this time with eyes closes. Visible chest fall and rise. PT oxygen at 95% on room air.

## 2024-04-11 NOTE — H&P (Signed)
 History and Physical    Kenneth Hart FMW:[id number] DOB: Mar 03, 1966 DOA: 04/10/2024  PCP: Pcp, No   Patient coming from: Home   Chief Complaint: Abnormal labs   HPI: [name] is a 58 y.o. 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  Patient has experienced some nausea, early satiety, and exertional dyspnea recently but had been able to go about his usual activities until developing atraumatic pain and swelling of the left foot.  He was evaluated for this at an employee health clinic yesterday, was given a shot of Toradol, and sent for radiographs and blood work.  He was told that blood work was concerning for a kidney problem and that prompt evaluation in in the ED was recommended.  He does not take any prescription medications, has not had any blood work performed in 10 years or so, denies melena or hematochezia, and denies dysuria, suprapubic pain, or flank pain. The left foot pain and swelling is better now.   ED Course: Upon arrival to the ED, patient is found to be afebrile and saturating well on room air with normal HR and stable BP.  Labs are most notable for creatinine 5.73, albumin 2.7, calcium 5.0, WBC 13,600, and hemoglobin 9.0.  UA notable for bacteriuria and pyuria with small hemoglobin and 100 protein on dipstick.  Noncontrast chest CT is concerning for pulmonary edema and small pleural effusion.  CT renal stone study is notable for anasarca, no hydronephrosis or stones, and nonspecific symmetric perinephric stranding.  Nephrology was consulted by the ED physician and the patient was treated with 500 mL of NS, 1 g calcium gluconate, and 1 g IV Rocephin.  Review of Systems:  All other systems reviewed and apart from HPI, are negative.  Past Medical History:  Diagnosis Date   Hypertension      Past Surgical History:  Procedure Laterality Date   HAND SURGERY     amputated and reattached    Social History:   reports that he has been smoking. He does not have any smokeless tobacco history on file. He reports current alcohol use. No history on file for drug use.  No Known Allergies  History reviewed. No pertinent family history.   Prior to Admission medications   Medication Sig Start Date End Date Taking? Authorizing Provider  OVER THE COUNTER MEDICATION Blood pressure medicine    [provider]  PredniSONE  10 MG KIT 12 day dose pack po 10/04/13   Corey, Evan S, MD  traMADol  (ULTRAM ) 50 MG tablet Take 1 tablet (50 mg total) by mouth every 6 (six) hours as needed. 10/04/13   Joane Artist RAMAN, MD    Physical Exam: Vitals:   04/10/24 2253 04/11/24 0130  BP: 126/80 131/85  Pulse: 99 91  Resp: 20 19  Temp: 98.3 F (36.8 C)   SpO2: 96% 96%    Constitutional: NAD, calm  Eyes: PERTLA, lids and conjunctivae normal ENMT: Mucous membranes are moist. Posterior pharynx clear of any exudate or lesions.   Neck: supple, no masses  Respiratory: Speaking in full sentences. No wheezing.   Cardiovascular: S1 & S2 heard, regular rate and rhythm. Bilateral lower extremity edema.   Abdomen: No tenderness, soft. Bowel sounds active.  Musculoskeletal: no clubbing / cyanosis. Right forearm deformity and hand edema.   Skin: no significant rashes, lesions, ulcers.  Warm, dry, well-perfused. Neurologic: CN 2-12 grossly intact. Moving all extremities. Alert and oriented.  Psychiatric: Pleasant. Cooperative.    Labs and Imaging on Admission: I have personally reviewed following labs and imaging studies  CBC: Recent Labs  Lab 04/10/24 2305 04/11/24 0106  WBC 13.6*  --   HGB 9.0* 10.5*  HCT 30.9* 31.0*  MCV 84.7  --   PLT 336  --    Basic Metabolic Panel: Recent Labs  Lab 04/10/24 2305 04/11/24 0106  NA 142 142  K 3.3* 3.3*  CL 103 107  CO2 23  --   GLUCOSE 132* 104*   BUN 45* 47*  CREATININE 5.73* 6.00*  CALCIUM 5.0*  --    GFR: CrCl cannot be calculated (Unknown ideal weight.). Liver Function Tests: Recent Labs  Lab 04/10/24 2305  AST 20  ALT 27  ALKPHOS 88  BILITOT 0.2  PROT 6.2*  ALBUMIN 2.7*   No results for input(s): LIPASE, AMYLASE in the last 168 hours. No results for input(s): AMMONIA in the last 168 hours. Coagulation Profile: No results for input(s): INR, PROTIME in the last 168 hours. Cardiac Enzymes: No results for input(s): CKTOTAL, CKMB, CKMBINDEX, TROPONINI in the last 168 hours. BNP (last 3 results) No results for input(s): PROBNP in the last 8760 hours. HbA1C: No results for input(s): HGBA1C in the last 72 hours. CBG: No results for input(s): GLUCAP in the last 168 hours. Lipid Profile: No results for input(s): CHOL, HDL, LDLCALC, TRIG, CHOLHDL, LDLDIRECT in the last 72 hours. Thyroid Function Tests: No results for input(s): TSH, T4TOTAL, FREET4, T3FREE, THYROIDAB in the last 72 hours. Anemia Panel: No results for input(s): VITAMINB12, FOLATE, FERRITIN, TIBC, IRON, RETICCTPCT in the last 72 hours. Urine analysis:    Component Value Date/Time   COLORURINE YELLOW 04/10/2024 2305   APPEARANCEUR HAZY (A) 04/10/2024 2305   LABSPEC 1.010 04/10/2024 2305   PHURINE 5.0 04/10/2024 2305   GLUCOSEU NEGATIVE 04/10/2024 2305   HGBUR SMALL (A) 04/10/2024 2305   BILIRUBINUR NEGATIVE 04/10/2024 2305   KETONESUR NEGATIVE 04/10/2024 2305   PROTEINUR 100 (A) 04/10/2024 2305   NITRITE NEGATIVE 04/10/2024 2305   LEUKOCYTESUR LARGE (A) 04/10/2024 2305   Sepsis Labs: @LABRCNTIP (procalcitonin:4,lacticidven:4) )No results found for this or any previous visit (from the past 240 hours).   Radiological Exams on Admission: CT Chest Wo Contrast Result Date: 04/11/2024 EXAM: CT CHEST WITHOUT CONTRAST 04/11/2024 01:50:27 AM TECHNIQUE: CT of the chest was performed without the  administration of intravenous contrast. Multiplanar reformatted images are provided for review. Automated exposure control, iterative reconstruction, and/or weight based adjustment of the mA/kV was utilized to reduce the radiation dose to as low as reasonably achievable. COMPARISON: None available. CLINICAL HISTORY: Respiratory illness, nondiagnostic xray. FINDINGS: MEDIASTINUM: Cardiomegaly. Low-density blood pool compatible with anemia. Pericardium is unremarkable. The central airways are clear. LYMPH NODES: Shotty mediastinal lymph nodes likely reactive. Evaluation of hilar lymph nodes is limited without IV contrast. No axillary lymphadenopathy. LUNGS AND PLEURA: Diffuse interlobular septal thickening and bronchial wall thickening. Peribronchovascular patchy ground glass opacities. Small right and trace left pleural effusions. No pneumothorax. SOFT TISSUES/BONES: No acute abnormality of the bones or soft tissues. UPPER ABDOMEN: Limited images of the upper abdomen demonstrates no acute abnormality. IMPRESSION: 1. CHF with interstitial and alveolar edema. Small right and trace left pleural effusion. Electronically signed by: [name] MD 04/11/2024 02:00 AM EST RP Workstation: [id number]   CT Renal Stone Study Result Date: 04/11/2024 EXAM: CT UROGRAM 04/11/2024 01:50:27 AM TECHNIQUE: CT  of the abdomen and pelvis was performed without the administration of intravenous contrast as per CT urogram protocol. Multiplanar reformatted images as well as MIP urogram images are provided for review. Automated exposure control, iterative reconstruction, and/or weight based adjustment of the mA/kV was utilized to reduce the radiation dose to as low as reasonably achievable. COMPARISON: None available. CLINICAL HISTORY: Abdominal/flank pain, stone suspected. FINDINGS: LOWER CHEST: Small right pleural effusion. Interlobular septal thickening and patchy ground-glass opacities in the lower lung suspicious for edema.  Cardiomegaly. LIVER: The liver is unremarkable. GALLBLADDER AND BILE DUCTS: Gallbladder is unremarkable. No biliary ductal dilatation. SPLEEN: No acute abnormality. PANCREAS: No acute abnormality. ADRENAL GLANDS: No acute abnormality. KIDNEYS, URETERS AND BLADDER: No stones in the kidneys or ureters. No hydronephrosis. Nonspecific symmetric perinephric stranding. Urinary bladder is unremarkable. GI AND BOWEL: Stomach demonstrates no acute abnormality. There is no bowel obstruction. Normal appendix. PERITONEUM AND RETROPERITONEUM: No ascites. No free air. Mesenteric edema. VASCULATURE: Aorta is normal in caliber. LYMPH NODES: No lymphadenopathy. REPRODUCTIVE ORGANS: No acute abnormality. BONES AND SOFT TISSUES: No acute osseous abnormality. Body wall anasarca. IMPRESSION: 1. Findings suggest congestive heart failure with 3rd spacing of fluid including pulmonary, mesenteric, and body wall edema and small right pleural effusion. Electronically signed by: [name] MD 04/11/2024 01:57 AM EST RP Workstation: [id number]    EKG: Independently reviewed. Sinus rhythm, QTc 508 ms.   Assessment/Plan   1. Renal failure; hypocalcemia; hypokalemia  - BUN is 45 and SCr 5.73 (normal on last labs from 2010) with potassium 3.3 and calcium 5.0  - No stones or hydronephrosis on CT; urine dipstick with small Hgb and 100 protein  - Check FENa, urine albumin to creatinine ratio, and phosphorus, supplement potassium and calcium, follow strict I/Os, repeat serum chemistries in am   2. Pulmonary edema; anasarca   - Pulmonary edema noted on chest CT in ED  - No prior echocardiogram or known CHF, albumin is 2.7 on admission and he is in renal failure  - Check echocardiogram, monitor strict I/Os and daily weight    3. Anemia  - No overt bleeding, may be due to renal disease  - Check anemia panel, repeat CBC in am   4. Prolonged QT interval  - QTc 508 ms in ED  - Correct hypokalemia, check magnesium level, avoid  QT-prolonging medications    5. Bacteriuria   - Rocephin was administered and urine culture ordered in the ED - No UTI symptoms, will hold off on additional antibiotic    DVT prophylaxis: sq heparin  Code Status: Full  Level of Care: Level of care: Telemetry Family Communication: None present  Disposition Plan:  Patient is from: Home  Anticipated d/c is to: Home  Anticipated d/c date is: TBD Patient currently: Pending nephrology consultation, echocardiogram, correction of electrolytes  Consults called: Nephrology  Admission status: Observation     Evalene GORMAN Sprinkles, MD [facility]  04/11/2024, 2:30 AM

## 2024-04-11 NOTE — ED Notes (Signed)
 PT awake, Lilly, RN and family at bedside. Bed locked and in lowest position, call light within reach. Encouraged PT and family to call if assistance is needed.

## 2024-04-11 NOTE — ED Notes (Signed)
 Head to toe assesment completed. Not syncing. Head to toe is WDL with exceptions to pt having productive cough and congestion. Along with rt foot pain in swelling from gout

## 2024-04-11 NOTE — ED Notes (Signed)
 Pt bladder scanned. in bladder. Pt reports urinating on arrival approx 2 hours ago.

## 2024-04-11 NOTE — ED Notes (Signed)
 CCMD called.

## 2024-04-11 NOTE — ED Notes (Signed)
 Urinal emptied. 320 output. Breathing is even and unlabored. Call light within reach,encouraged to use when needs arise.  Two support persons at bedside. PT resting in bed.

## 2024-04-11 NOTE — Consult Note (Addendum)
 [facility] Nephrology Consultation Note  Requesting MD: Dr. Darci, LOISE. Reason for consult: Renal failure.  HPI:  Kenneth Hart is a 58 y.o. male with past medical history significant for hypertension, no medical follow-up for more than 10 years seen for the evaluation and management of renal failure. The patient said he was having leg pain for last few days therefore seen by his provider at work on 11/10.  The lab were drawn during the visit and the patient received a call later with abnormal lab especially elevated BUN and creatinine level, anemia.  He was advised to go to ER for further evaluation. The patient said he was diagnosed with hypertension and supposed to be on lisinopril however he was not taking medication because of feeling dizzy.  He did not see any provider for more than 10 years.  The creatinine level was 0.88 back in 2010.  Since then no labs available until 04/10/2024 point creatinine level was elevated to 5.73, BUN 45, potassium 3.3, calcium 5.0, albumin 2.7, magnesium 1.3, elevated BNP of 1608, anemic with hemoglobin ranging between 9-10.5.  The urinalysis with protein 100, many WBC but no microscopic hematuria.  CT scan of chest with evidence of CHF and bilateral pulmonary edema and pleural effusion.  The CT scan of kidney showed no hydronephrosis and no nephrolithiasis. The patient denies use of NSAIDs, or any prescription medication.  He denies dysuria, urgency, frequency or change in his urinary habit.  He has intermittent nausea but no vomiting.  Reports dyspnea on exertion and intermittent SOB.  No chest pain. He works at [employer]. In the ER, he is on room air, blood pressure usually normal range, afebrile.  He received calcium, magnesium and potassium.  He was accompanied by his son in the room. PMHx:   Past Medical History:  Diagnosis Date   Hypertension     Past Surgical History:  Procedure Laterality Date   HAND SURGERY      amputated and reattached    Family Hx: History reviewed. No pertinent family history.  Social History:  reports that he has been smoking. He does not have any smokeless tobacco history on file. He reports current alcohol use. No history on file for drug use.  Allergies:  Allergies  Allergen Reactions   Amoxicillin Hives    Medications: Prior to Admission medications   Not on File    I have reviewed the patient's current medications.  Labs: Renal Panel: Recent Labs  Lab 04/10/24 2305 04/11/24 0058 04/11/24 0106 04/11/24 0350  NA 142  --  142 139  K 3.3*  --  3.3* 3.4*  CL 103  --  107 107  CO2 23  --   --  22  GLUCOSE 132*  --  104* 88  BUN 45*  --  47* 47*  CREATININE 5.73*  --  6.00* 5.63*  CALCIUM 5.0*  --   --  4.9*  MG  --  1.3*  --  1.3*  PHOS  --   --   --  5.5*     CBC:    Latest Ref Rng & Units 04/11/2024    3:50 AM 04/11/2024    1:06 AM 04/10/2024   11:05 PM  CBC  WBC 4.0 - 10.5 K/uL 11.0   13.6   Hemoglobin 13.0 - 17.0 g/dL 8.5  89.4  9.0   Hematocrit 39.0 - 52.0 % 29.3  31.0  30.9   Platelets 150 - 400 K/uL  309   336      Anemia Panel:  Recent Labs    04/10/24 2305 04/11/24 0106 04/11/24 0350  HGB 9.0* 10.5* 8.5*  MCV 84.7  --  84.4  VITAMINB12  --   --  372  FOLATE  --   --  7.0  FERRITIN  --   --  65  TIBC  --   --  272  IRON  --   --  23*  RETICCTPCT  --   --  1.0    Recent Labs  Lab 04/10/24 2305  AST 20  ALT 27  ALKPHOS 88  BILITOT 0.2  PROT 6.2*  ALBUMIN 2.7*    No results found for: HGBA1C  ROS:  Pertinent items noted in HPI and remainder of comprehensive ROS otherwise negative.  Physical Exam: Vitals:   04/11/24 0905 04/11/24 1000  BP:  (!) 137/92  Pulse: 86 96  Resp: 14 (!) 22  Temp: 98.7 F (37.1 C)   SpO2: 96% 95%     General exam: Appears calm and comfortable  Respiratory system: Bibasilar crackles, no increased work of breathing. Cardiovascular system: S1 & S2 heard, RRR.  Bilateral pedal  edema present. Gastrointestinal system: Abdomen is distended, soft and nontender.  Central nervous system: Alert and oriented. No focal neurological deficits. Extremities: b/l LE edema ++ Skin: No rashes, lesions or ulcers Psychiatry: Judgement and insight appear normal. Mood & affect appropriate.   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  The patient had normal creatinine back in 2010 and then no follow-up labs until 04/2024. -He has evidence of fluid overload and early azotemia. -UA with trace protein but no significant RBC.  CT scan ruled out hydronephrosis. - I will check basic serologies, quantify proteinuria with UPC to complete the workup. - Start IV Lasix to manage volume. - I explained to the patient if there is no improvement in renal function then he will likely need dialysis.  He agreed with the plan.  # HTN/LE edema/pulmonary edema/fluid overload: Presumably contributed by low GFR.  Starting diuretics.  Strict ins and outs and close lab monitoring.  # Hypokalemia: Received potassium chloride repletion, follow lab.  # Anemia of CKD: Iron saturation 9% and serum iron level 23.  Start IV Venofer.  Monitor hemoglobin and assess need for erythropoietin.  # CKD-MBD/hypocalcemia: Check PTH level.  Received calcium repletion.  Monitor lab.  May benefit from calcitriol pending lab results.  Thank you for the consult.  We will continue to follow.  Sheriff Rodenberg Amelie Romney 04/11/2024, 1:11 PM  [employer].

## 2024-04-11 NOTE — ED Notes (Signed)
 Pt awake and sitting at bedside eating dinner. Call light within reach. Asked PT if any needs at this time, patient stated he's fine. Reminded pt to use call light if assistance is needed. Bed locked and in lowest position.

## 2024-04-11 NOTE — ED Notes (Signed)
 Urine sent to lab for protein/creatinine ratio.

## 2024-04-12 DIAGNOSIS — N185 Chronic kidney disease, stage 5: Secondary | ICD-10-CM | POA: Diagnosis not present

## 2024-04-12 DIAGNOSIS — I5021 Acute systolic (congestive) heart failure: Secondary | ICD-10-CM

## 2024-04-12 DIAGNOSIS — R8271 Bacteriuria: Secondary | ICD-10-CM | POA: Diagnosis not present

## 2024-04-12 DIAGNOSIS — E876 Hypokalemia: Secondary | ICD-10-CM | POA: Diagnosis not present

## 2024-04-12 DIAGNOSIS — I472 Ventricular tachycardia, unspecified: Secondary | ICD-10-CM | POA: Diagnosis not present

## 2024-04-12 LAB — CBC
HCT: 29.3 % — ABNORMAL LOW (ref 39.0–52.0)
Hemoglobin: 8.5 g/dL — ABNORMAL LOW (ref 13.0–17.0)
MCH: 24.4 pg — ABNORMAL LOW (ref 26.0–34.0)
MCHC: 29 g/dL — ABNORMAL LOW (ref 30.0–36.0)
MCV: 84.2 fL (ref 80.0–100.0)
Platelets: 317 K/uL (ref 150–400)
RBC: 3.48 MIL/uL — ABNORMAL LOW (ref 4.22–5.81)
RDW: 19.1 % — ABNORMAL HIGH (ref 11.5–15.5)
WBC: 10 K/uL (ref 4.0–10.5)
nRBC: 0 % (ref 0.0–0.2)

## 2024-04-12 LAB — BASIC METABOLIC PANEL WITH GFR
Anion gap: 12 (ref 5–15)
BUN: 49 mg/dL — ABNORMAL HIGH (ref 6–20)
CO2: 23 mmol/L (ref 22–32)
Calcium: 5.2 mg/dL — CL (ref 8.9–10.3)
Chloride: 108 mmol/L (ref 98–111)
Creatinine, Ser: 6.2 mg/dL — ABNORMAL HIGH (ref 0.61–1.24)
GFR, Estimated: 10 mL/min — ABNORMAL LOW (ref >60)
Glucose, Bld: 96 mg/dL (ref 70–99)
Potassium: 4.2 mmol/L (ref 3.5–5.1)
Sodium: 143 mmol/L (ref 135–145)

## 2024-04-12 LAB — LIPID PANEL
Cholesterol: 158 mg/dL (ref 0–200)
HDL: 48 mg/dL (ref >40)
LDL Cholesterol: 101 mg/dL — ABNORMAL HIGH (ref 0–99)
Total CHOL/HDL Ratio: 3.3 ratio
Triglycerides: 43 mg/dL (ref <150)
VLDL: 9 mg/dL (ref 0–40)

## 2024-04-12 LAB — URINE CULTURE: Culture: NO GROWTH

## 2024-04-12 LAB — PHOSPHORUS: Phosphorus: 6.6 mg/dL — ABNORMAL HIGH (ref 2.5–4.6)

## 2024-04-12 LAB — MAGNESIUM: Magnesium: 1.5 mg/dL — ABNORMAL LOW (ref 1.7–2.4)

## 2024-04-12 MED ORDER — SODIUM CHLORIDE 0.9 % IV SOLN
200.0000 mg | INTRAVENOUS | Status: AC
  Administered 2024-04-12: 200 mg via INTRAVENOUS
  Filled 2024-04-12: qty 10

## 2024-04-12 MED ORDER — CALCIUM GLUCONATE-NACL 1-0.675 GM/50ML-% IV SOLN
1.0000 g | Freq: Once | INTRAVENOUS | Status: AC
  Administered 2024-04-12: 1000 mg via INTRAVENOUS
  Filled 2024-04-12: qty 50

## 2024-04-12 MED ORDER — METOPROLOL SUCCINATE ER 50 MG PO TB24
50.0000 mg | ORAL_TABLET | Freq: Every day | ORAL | Status: DC
  Administered 2024-04-13 – 2024-04-15 (×3): 50 mg via ORAL
  Filled 2024-04-12 (×3): qty 1

## 2024-04-12 MED ORDER — FERROUS SULFATE 325 (65 FE) MG PO TABS
325.0000 mg | ORAL_TABLET | Freq: Every day | ORAL | Status: DC
  Administered 2024-04-13 – 2024-04-15 (×2): 325 mg via ORAL
  Filled 2024-04-12 (×2): qty 1

## 2024-04-12 MED ORDER — MAGNESIUM OXIDE -MG SUPPLEMENT 400 (240 MG) MG PO TABS
400.0000 mg | ORAL_TABLET | Freq: Every day | ORAL | Status: AC
  Administered 2024-04-12 – 2024-04-13 (×2): 400 mg via ORAL
  Filled 2024-04-12 (×2): qty 1

## 2024-04-12 MED ORDER — SEVELAMER CARBONATE 800 MG PO TABS
800.0000 mg | ORAL_TABLET | Freq: Three times a day (TID) | ORAL | Status: DC
  Administered 2024-04-12 – 2024-04-13 (×2): 800 mg via ORAL
  Filled 2024-04-12 (×2): qty 1

## 2024-04-12 MED ORDER — DARBEPOETIN ALFA 60 MCG/0.3ML IJ SOSY: 60.0000 ug | PREFILLED_SYRINGE | INTRAMUSCULAR | Status: DC

## 2024-04-12 MED ORDER — METOPROLOL TARTRATE 25 MG PO TABS
25.0000 mg | ORAL_TABLET | Freq: Once | ORAL | Status: AC
  Administered 2024-04-12: 25 mg via ORAL
  Filled 2024-04-12: qty 1

## 2024-04-12 MED ORDER — DARBEPOETIN ALFA 60 MCG/0.3ML IJ SOSY
60.0000 ug | PREFILLED_SYRINGE | INTRAMUSCULAR | Status: DC
  Administered 2024-04-12: 60 ug via SUBCUTANEOUS
  Filled 2024-04-12: qty 0.3

## 2024-04-12 NOTE — Progress Notes (Signed)
 [facility] NEPHROLOGY PROGRESS NOTE  Assessment/ Plan: Pt is a 58 y.o. yo male  with past medical history significant for hypertension, no medical follow-up for more than 10 years seen for the evaluation and management of renal failure.   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  The patient had normal creatinine back in 2010 and then no follow-up labs until 04/2024. -He has evidence of fluid overload and early azotemia. -Urine has around 0.9 g of protein, no RBC. CT scan ruled out hydronephrosis.  Hep B, hep C and HIV negative.  A1c 5.8. - Continue diuretics to manage volume.  Patient understands that he will likely need dialysis if no improvement in kidney function in next 1 to 2 days.  I will keep him n.p.o. postmidnight for possible HD catheter placement.   # HTN/LE edema/pulmonary edema/fluid overload: Presumably contributed by low GFR.  Treating with diuretics.  Strict ins and outs and close lab monitoring.   # Hypokalemia: Received potassium chloride repletion, follow lab.   # Anemia of CKD: Iron saturation 9% and serum iron level 23.  Starting IV Venofer and Aranesp.     # CKD-MBD/hypocalcemia: Pending PTH level.  Received calcium repletion.  Start sevelamer for hyperphosphatemia.  Monitor lab.  Subjective: Seen and examined at the bedside.  He had urine output of around 1.7 L.  Responding with IV diuretics.  Breathing is much better but he still have some SOB with exertion.  No chest pain.  Understands declining kidney function and possible need for dialysis soon. Objective Vital signs in last 24 hours: Vitals:   04/11/24 2226 04/12/24 0442 04/12/24 0500 04/12/24 0826  BP: (!) 138/91 128/85  136/86  Pulse: 84 83  79  Resp:  17  19  Temp: 98.5 F (36.9 C) 98.2 F (36.8 C)  98.7 F (37.1 C)  TempSrc:    Oral  SpO2: 96% 94%  97%  Weight:   112.5 kg   Height:   5' 7 (1.702 m)    Weight change:    Intake/Output Summary (Last 24 hours) at 04/12/2024 1214 Last data filed at 04/12/2024 0900 Gross per 24 hour  Intake 118 ml  Output 1800 ml  Net -1682 ml       Labs: RENAL PANEL Recent Labs  Lab 04/10/24 2305 04/11/24 0058 04/11/24 0106 04/11/24 0350 04/12/24 0346  NA 142  --  142 139 143  K 3.3*  --  3.3* 3.4* 4.2  CL 103  --  107 107 108  CO2 23  --   --  22 23  GLUCOSE 132*  --  104* 88 96  BUN 45*  --  47* 47* 49*  CREATININE 5.73*  --  6.00* 5.63* 6.20*  CALCIUM 5.0*  --   --  4.9* 5.2*  MG  --  1.3*  --  1.3* 1.5*  PHOS  --   --   --  5.5* 6.6*  ALBUMIN 2.7*  --   --   --   --     Liver Function Tests: Recent Labs  Lab 04/10/24 2305  AST 20  ALT 27  ALKPHOS 88  BILITOT 0.2  PROT 6.2*  ALBUMIN 2.7*   No results for input(s): LIPASE, AMYLASE in the last 168 hours. No results for input(s): AMMONIA in the last 168 hours. CBC: Recent Labs    04/10/24 2305 04/11/24 0106 04/11/24 0350 04/12/24 0346  HGB 9.0* 10.5* 8.5*  8.5*  MCV 84.7  --  84.4 84.2  VITAMINB12  --   --  372  --   FOLATE  --   --  7.0  --   FERRITIN  --   --  65  --   TIBC  --   --  272  --   IRON  --   --  23*  --   RETICCTPCT  --   --  1.0  --     Cardiac Enzymes: Recent Labs  Lab 04/11/24 0350  CKTOTAL 472*   CBG: No results for input(s): GLUCAP in the last 168 hours.  Iron Studies:  Recent Labs    04/11/24 0350  IRON 23*  TIBC 272  FERRITIN 65   Studies/Results: ECHOCARDIOGRAM COMPLETE Result Date: 04/11/2024    ECHOCARDIOGRAM REPORT   Patient Name:   Kenneth Hart Date of Exam: 04/11/2024 Medical Rec #:  [id number]        Height:       67.0 in Accession #:    [id number]       Weight:       265.0 lb Date of Birth:  Oct 20, 1965        BSA:          2.279 m Patient Age:    58 years         BP:           146/88 mmHg Patient Gender: M                HR:           88 bpm. Exam Location:  Inpatient Procedure: 2D Echo, Cardiac Doppler and Color Doppler (Both  Spectral and Color            Flow Doppler were utilized during procedure). Indications:    I50.31 Acute diastolic (congestive) heart failure  History:        Patient has no prior history of Echocardiogram examinations.                 Risk Factors:Hypertension.  Sonographer:    Damien Senior RDCS Referring Phys: [id number] TIMOTHY S OPYD IMPRESSIONS  1. Left ventricular ejection fraction, by estimation, is 40 to 45%. Left ventricular ejection fraction by 2D MOD biplane is 43.0 %. The left ventricle has mildly decreased function. The left ventricle demonstrates regional wall motion abnormalities (see  scoring diagram/findings for description). The left ventricular internal cavity size was mildly dilated. There is mild concentric left ventricular hypertrophy. Left ventricular diastolic parameters are consistent with Grade III diastolic dysfunction (restrictive). Elevated left atrial pressure. There is moderate hypokinesis of the left ventricular, basal-mid inferoseptal wall, inferior wall and inferolateral wall.  2. Right ventricular systolic function is mildly reduced. The right ventricular size is moderately enlarged. Tricuspid regurgitation signal is inadequate for assessing PA pressure.  3. Left atrial size was moderately dilated.  4. The mitral valve is normal in structure. Trivial mitral valve regurgitation.  5. The aortic valve is tricuspid. Aortic valve regurgitation is not visualized.  6. The inferior vena cava is dilated in size with <50% respiratory variability, suggesting right atrial pressure of 15 mmHg. FINDINGS  Left Ventricle: Left ventricular ejection fraction, by estimation, is 40 to 45%. Left ventricular ejection fraction by 2D MOD biplane is 43.0 %. The left ventricle has mildly decreased function. The left ventricle demonstrates regional wall motion abnormalities. Moderate hypokinesis of the left ventricular, basal-mid inferoseptal wall, inferior wall and inferolateral wall. The  left ventricular  internal cavity size was mildly dilated. There is mild concentric left ventricular hypertrophy. Left ventricular diastolic parameters are consistent with Grade III diastolic dysfunction (restrictive). Elevated left atrial pressure. Right Ventricle: The right ventricular size is moderately enlarged. No increase in right ventricular wall thickness. Right ventricular systolic function is mildly reduced. Tricuspid regurgitation signal is inadequate for assessing PA pressure. Left Atrium: Left atrial size was moderately dilated. Right Atrium: Right atrial size was normal in size. Pericardium: There is no evidence of pericardial effusion. Mitral Valve: The mitral valve is normal in structure. Trivial mitral valve regurgitation. Tricuspid Valve: The tricuspid valve is normal in structure. Tricuspid valve regurgitation is trivial. Aortic Valve: The aortic valve is tricuspid. Aortic valve regurgitation is not visualized. Pulmonic Valve: The pulmonic valve was grossly normal. Pulmonic valve regurgitation is not visualized. Aorta: The aortic root and ascending aorta are structurally normal, with no evidence of dilitation. Venous: The inferior vena cava is dilated in size with less than 50% respiratory variability, suggesting right atrial pressure of 15 mmHg. IAS/Shunts: No atrial level shunt detected by color flow Doppler.  LEFT VENTRICLE PLAX 2D                        Biplane EF (MOD) LVIDd:         5.80 cm         LV Biplane EF:   Left LVIDs:         4.20 cm                          ventricular LV PW:         1.20 cm                          ejection LV IVS:        1.40 cm                          fraction by LVOT diam:     2.20 cm                          2D MOD LV SV:         81                               biplane is LV SV Index:   36                               43.0 %. LVOT Area:     3.80 cm LV IVRT:       87 msec         Diastology                                LV e' medial:    4.90 cm/s                                 LV E/e' medial:  25.9 LV Volumes (MOD)               LV e' lateral:  4.24 cm/s LV vol d, MOD    149.0 ml      LV E/e' lateral: 30.0 A2C: LV vol d, MOD    170.0 ml A4C: LV vol s, MOD    87.6 ml A2C: LV vol s, MOD    96.0 ml A4C: LV SV MOD A2C:   61.4 ml LV SV MOD A4C:   170.0 ml LV SV MOD BP:    71.0 ml RIGHT VENTRICLE RV S prime:     10.90 cm/s TAPSE (M-mode): 1.8 cm LEFT ATRIUM              Index        RIGHT ATRIUM           Index LA diam:        6.00 cm  2.63 cm/m   RA Area:     23.30 cm LA Vol (A2C):   84.4 ml  37.03 ml/m  RA Volume:   76.90 ml  33.74 ml/m LA Vol (A4C):   109.0 ml 47.83 ml/m LA Biplane Vol: 97.1 ml  42.61 ml/m  AORTIC VALVE LVOT Vmax:   107.00 cm/s LVOT Vmean:  79.600 cm/s LVOT VTI:    0.214 m  AORTA Ao Root diam: 3.30 cm Ao Asc diam:  3.20 cm MITRAL VALVE MV Area (PHT): 3.85 cm     SHUNTS MV Decel Time: 197 msec     Systemic VTI:  0.21 m MV E velocity: 127.00 cm/s  Systemic Diam: 2.20 cm MV A velocity: 48.40 cm/s MV E/A ratio:  2.62 Mihai Croitoru MD Electronically signed by Jerel Balding MD Signature Date/Time: 04/11/2024/9:18:51 AM    Final    CT Chest Wo Contrast Result Date: 04/11/2024 EXAM: CT CHEST WITHOUT CONTRAST 04/11/2024 01:50:27 AM TECHNIQUE: CT of the chest was performed without the administration of intravenous contrast. Multiplanar reformatted images are provided for review. Automated exposure control, iterative reconstruction, and/or weight based adjustment of the mA/kV was utilized to reduce the radiation dose to as low as reasonably achievable. COMPARISON: None available. CLINICAL HISTORY: Respiratory illness, nondiagnostic xray. FINDINGS: MEDIASTINUM: Cardiomegaly. Low-density blood pool compatible with anemia. Pericardium is unremarkable. The central airways are clear. LYMPH NODES: Shotty mediastinal lymph nodes likely reactive. Evaluation of hilar lymph nodes is limited without IV contrast. No axillary lymphadenopathy. LUNGS AND PLEURA: Diffuse interlobular  septal thickening and bronchial wall thickening. Peribronchovascular patchy ground glass opacities. Small right and trace left pleural effusions. No pneumothorax. SOFT TISSUES/BONES: No acute abnormality of the bones or soft tissues. UPPER ABDOMEN: Limited images of the upper abdomen demonstrates no acute abnormality. IMPRESSION: 1. CHF with interstitial and alveolar edema. Small right and trace left pleural effusion. Electronically signed by: [name] MD 04/11/2024 02:00 AM EST RP Workstation: [id number]   CT Renal Stone Study Result Date: 04/11/2024 EXAM: CT UROGRAM 04/11/2024 01:50:27 AM TECHNIQUE: CT of the abdomen and pelvis was performed without the administration of intravenous contrast as per CT urogram protocol. Multiplanar reformatted images as well as MIP urogram images are provided for review. Automated exposure control, iterative reconstruction, and/or weight based adjustment of the mA/kV was utilized to reduce the radiation dose to as low as reasonably achievable. COMPARISON: None available. CLINICAL HISTORY: Abdominal/flank pain, stone suspected. FINDINGS: LOWER CHEST: Small right pleural effusion. Interlobular septal thickening and patchy ground-glass opacities in the lower lung suspicious for edema. Cardiomegaly. LIVER: The liver is unremarkable. GALLBLADDER AND BILE DUCTS: Gallbladder is unremarkable. No biliary ductal dilatation. SPLEEN: No acute abnormality. PANCREAS: No acute abnormality.  ADRENAL GLANDS: No acute abnormality. KIDNEYS, URETERS AND BLADDER: No stones in the kidneys or ureters. No hydronephrosis. Nonspecific symmetric perinephric stranding. Urinary bladder is unremarkable. GI AND BOWEL: Stomach demonstrates no acute abnormality. There is no bowel obstruction. Normal appendix. PERITONEUM AND RETROPERITONEUM: No ascites. No free air. Mesenteric edema. VASCULATURE: Aorta is normal in caliber. LYMPH NODES: No lymphadenopathy. REPRODUCTIVE ORGANS: No acute abnormality. BONES  AND SOFT TISSUES: No acute osseous abnormality. Body wall anasarca. IMPRESSION: 1. Findings suggest congestive heart failure with 3rd spacing of fluid including pulmonary, mesenteric, and body wall edema and small right pleural effusion. Electronically signed by: [name] MD 04/11/2024 01:57 AM EST RP Workstation: [id number]    Medications: Infusions:  iron sucrose      Scheduled Medications:  [START ON 04/13/2024] ferrous sulfate  325 mg Oral Q breakfast   furosemide  80 mg Intravenous BID   heparin  5,000 Units Subcutaneous Q8H   metoprolol succinate  25 mg Oral Daily   sodium chloride flush  3 mL Intravenous Q12H    have reviewed scheduled and prn medications.  Physical Exam: General:NAD, comfortable Heart:RRR, s1s2 nl Lungs:clear b/l, no crackle Abdomen:soft, Non-tender, non-distended Extremities: Bilateral leg edema present, chronic in nature Neurology: Alert awake and following commands Dialysis Access: Not yet  Analiz Tvedt Prasad Alexzandria Massman 04/12/2024,12:14 PM  LOS: 1 day

## 2024-04-12 NOTE — TOC CM/SW Note (Signed)
 Transition of Care [facility]) - Inpatient Brief Assessment   Patient Details  Name: Kenneth Hart MRN: [id number] Date of Birth: 1965-10-25  Transition of [facility]) CM/SW Contact:    Tom-Johnson, Ariellah Faust Daphne, RN Phone Number: 04/12/2024, 4:13 PM   Clinical Narrative:  Patient presented to the ED with Lt Foot pain and abnormal labs which were drawn at his place of employment. Patient states he has not been seen by a PCP in 84yrs. CT Chest showed Pulmonary Edema and small Pleural Effusion. CT Renal Stone Study notable for Anasarca.  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 Admitted with Renal Failure, Creatinine on admit was 5.73. Nephrology following for possible need of Hemodialysis if Renal Function does not improve. Plan for possible HD Catheter placement.   CM spoke with patient at bedside about needs for post hospital transition. Patient states he lives with his son Donnice, has four supportive children. Father and two siblings supportive. Employed, independent with care and drive self. Does not have DME's at home.  Does not have a PCP, CM called to schedule new patient establishment and the closest date available is in January. All Clinics are booked till next year, info on AVS.   Patient not Medically ready for discharge. No PT f/u noted. CM will continue to follow as patient progresses with care towards discharge.           Transition of Care Asessment: Insurance and Status: Insurance coverage has been reviewed Patient has primary care physician: Yes Home environment has been reviewed: Yes Prior level of function:: Independent Prior/Current Home Services: No current home services Social Drivers of Health Review: SDOH reviewed no interventions necessary Readmission risk has been reviewed: Yes Transition of care needs: transition of care needs identified, TOC will continue to follow (No PCP)

## 2024-04-12 NOTE — Progress Notes (Signed)
 PT Cancellation Note  Patient Details Name: Kenneth Hart MRN: [id number] DOB: 1966-03-31   Cancelled Treatment:    Reason Eval/Treat Not Completed: PT screened, no needs identified, will sign off (pt independent per OT).  Aleck Daring, PT, DPT Acute Rehabilitation Services Office [phone]    Aleck ONEIDA Daring 04/12/2024, 2:44 PM

## 2024-04-12 NOTE — Plan of Care (Signed)

## 2024-04-12 NOTE — Progress Notes (Signed)
 Was notified by telemetry that pt had a 33 beat run of VTACH. Assessed patient, pt was in no acute distress and denies and cp or discomfort. MD nitified.

## 2024-04-12 NOTE — Evaluation (Signed)
 Occupational Therapy Evaluation and Discharge Patient Details Name: Kenneth Hart MRN: [id number] DOB: 12/21/1965 Today's Date: 04/12/2024   History of Present Illness   Pt is a 58 year old man admitted on 04/11/24 with abnormal kidney function labs when asssesd at his workplace. Pt + for renal failure, pulmonary edema, anemia and electrolyte disturbance. PMH: untreated HTN, L foot gout, current smoker, no medical treatment in 10 years.     Clinical Impressions Pt is functioning modified independently to independently in ADLs and mobility. Has been routinely ambulating to bathroom and around his room. OT is signing off.      If plan is discharge home, recommend the following:         Functional Status Assessment   Patient has not had a recent decline in their functional status     Equipment Recommendations   None recommended by OT     Recommendations for Other Services         Precautions/Restrictions   Precautions Precautions: None Recall of Precautions/Restrictions: Intact     Mobility Bed Mobility Overal bed mobility: Modified Independent             General bed mobility comments: HOB up    Transfers Overall transfer level: Modified independent                 General transfer comment: pushed IV pole without assist      Balance Overall balance assessment: Independent                                         ADL either performed or assessed with clinical judgement   ADL Overall ADL's : Independent                                             Vision Ability to See in Adequate Light: 0 Adequate Patient Visual Report: No change from baseline       Perception         Praxis         Pertinent Vitals/Pain Pain Assessment Pain Assessment: No/denies pain     Extremity/Trunk Assessment Upper Extremity Assessment Upper Extremity Assessment: Overall WFL for tasks assessed   Lower  Extremity Assessment Lower Extremity Assessment: Overall WFL for tasks assessed   Cervical / Trunk Assessment Cervical / Trunk Assessment: Normal;Other exceptions (obese)   Communication Communication Communication: No apparent difficulties   Cognition Arousal: Alert Behavior During Therapy: WFL for tasks assessed/performed Cognition: No apparent impairments                               Following commands: Intact       Cueing  General Comments   Cueing Techniques: Verbal cues      Exercises     Shoulder Instructions      Home Living Family/patient expects to be discharged to:: Private residence Living Arrangements: Children (son) Available Help at Discharge: Family;Available PRN/intermittently Type of Home: House Home Access: Stairs to enter [employer] of Steps: 8   Home Layout: One level     Bathroom Shower/Tub: Chief Strategy Officer: Standard     Home Equipment: None  Prior Functioning/Environment Prior Level of Function : Independent/Modified Independent;Working/employed;Driving                    OT Problem List:     OT Treatment/Interventions:        OT Goals(Current goals can be found in the care plan section)       OT Frequency:       Co-evaluation              AM-PAC OT 6 Clicks Daily Activity     Outcome Measure Help from another person eating meals?: None Help from another person taking care of personal grooming?: None Help from another person toileting, which includes using toliet, bedpan, or urinal?: None Help from another person bathing (including washing, rinsing, drying)?: None Help from another person to put on and taking off regular upper body clothing?: None Help from another person to put on and taking off regular lower body clothing?: None 6 Click Score: 24   End of Session    Activity Tolerance: Patient tolerated treatment well Patient left: in bed;with  call bell/phone within reach  OT Visit Diagnosis: Muscle weakness (generalized) (M62.81)                Time: 8566-8550 OT Time Calculation (min): 16 min Charges:  OT General Charges $OT Visit: 1 Visit OT Evaluation $OT Eval Low Complexity: 1 Low  Mliss HERO, OTR/L Acute Rehabilitation Services Office: [phone]   Kennth Mliss Hart 04/12/2024, 2:49 PM

## 2024-04-12 NOTE — Progress Notes (Signed)
 Progress Note   Patient: Kenneth Hart FMW:[id number] DOB: 02-18-1966 DOA: 04/10/2024     1 DOS: the patient was seen and examined on 04/12/2024   Brief hospital course: Kenneth Hart is a 58 y.o. 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   Patient has experienced some nausea, early satiety, and exertional dyspnea recently but had been able to go about his usual activities until developing atraumatic pain and swelling of the left foot.  He was evaluated for this at an employee health clinic yesterday, was given a shot of Toradol, and sent for radiographs and blood work.  He was told that blood work was concerning for a kidney problem and that prompt evaluation in the ED was recommended. Nephrology consulted, admitted to TRH service for further management.  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 A1c 5.8, no hydronephrosis on CT abdomen, Hep, HIV negative. Nephrology follow up appreciated. Lasix 80mg  BID ordered. If renal function did not improve.He may end up on HD.  Hypocalcemia- He got multiple IV calcium gluconate doses. 1gm calcium gluconate ordered. Started sevelamer for hyperphosphatemia.  Hypokalemia- repleted. Hypomagnesemia- oral supplements ordered.   New Onset HFmrEF- Pulmonary edema noted on chest CT in ED  Echocardiogram revealed EF 40%, focal wall motion abnormality. Cardiology evaluation appreciated. He is not candidate for heart cath due to renal dysfunction.  No evidence of overt fluid overload. Runs of NSVT noted, started on metoprolol 25 bid. Continue telemetry.   Anemia of chronic disease Iron deficiency  IV iron, aranesp per nephrology. Oral iron supplements ordered.   Prolonged QT interval  QTc 508 ms  in ED  Monitor electrolytes with correction, avoid QT-prolonging medications     Bacteriuria   UA wbc >50, large LE, no urinary symptoms, await urine cultures before starting antibiotics.  Obesity Class II- BMI 38.85 Weight reduction, low calorie diet ordered.     Out of bed to chair. Incentive spirometry. Nursing supportive care. Fall, aspiration precautions. Diet:  Diet Orders (From admission, onward)     Start     Ordered   04/13/24 0001  Diet NPO time specified  Diet effective midnight       Comments: For possible HD cath in am   04/12/24 1222   04/11/24 0226  Diet regular Room service appropriate? Yes; Fluid consistency: Thin  Diet effective now       Question Answer Comment  Room service appropriate? Yes   Fluid consistency: Thin      04/11/24 0230           DVT prophylaxis: heparin injection 5,000 Units Start: 04/11/24 0600  Level of care: Telemetry   Code Status: Full Code  Subjective: Patient is seen and examined today morning. He is sleeping, able to arouse, denies any complaints. Advised out of bed, work with PT/ OT.  Physical Exam: Vitals:   04/11/24 2226 04/12/24 0442 04/12/24 0500 04/12/24 0826  BP: (!) 138/91 128/85  136/86  Pulse: 84 83  79  Resp:  17  19  Temp: 98.5 F (36.9 C) 98.2 F (36.8 C)  98.7 F (37.1 C)  TempSrc:    Oral  SpO2: 96% 94%  97%  Weight:   112.5 kg   Height:   5' 7 (1.702 m)  General - Elderly obese African American male, no apparent distress HEENT - PERRLA, EOMI, atraumatic head, non tender sinuses. Lung - distant breath sounds, basal rales, rhonchi, no wheezes. Heart - S1, S2 heard, no murmurs, rubs, 1+ pedal edema. Abdomen - Soft, non tender, obese, bowel sounds good Neuro - Alert, awake and oriented x 3, non focal exam. Skin - Warm and dry.  Data Reviewed:      Latest Ref Rng & Units 04/12/2024    3:46 AM 04/11/2024    3:50 AM 04/11/2024    1:06 AM  CBC  WBC 4.0 - 10.5 K/uL 10.0  11.0     Hemoglobin 13.0 - 17.0 g/dL 8.5  8.5  89.4   Hematocrit 39.0 - 52.0 % 29.3  29.3  31.0   Platelets 150 - 400 K/uL 317  309        Latest Ref Rng & Units 04/12/2024    3:46 AM 04/11/2024    3:50 AM 04/11/2024    1:06 AM  BMP  Glucose 70 - 99 mg/dL 96  88  895   BUN 6 - 20 mg/dL 49  47  47   Creatinine 0.61 - 1.24 mg/dL 3.79  4.36  3.99   Sodium 135 - 145 mmol/L 143  139  142   Potassium 3.5 - 5.1 mmol/L 4.2  3.4  3.3   Chloride 98 - 111 mmol/L 108  107  107   CO2 22 - 32 mmol/L 23  22    Calcium 8.9 - 10.3 mg/dL 5.2  4.9     ECHOCARDIOGRAM COMPLETE Result Date: 04/11/2024    ECHOCARDIOGRAM REPORT   Patient Name:   Kenneth Hart Date of Exam: 04/11/2024 Medical Rec #:  [id number]        Height:       67.0 in Accession #:    [id number]       Weight:       265.0 lb Date of Birth:  [birth year]        BSA:          2.279 m Patient Age:    58 years         BP:           146/88 mmHg Patient Gender: M                HR:           88 bpm. Exam Location:  Inpatient Procedure: 2D Echo, Cardiac Doppler and Color Doppler (Both Spectral and Color            Flow Doppler were utilized during procedure). Indications:    I50.31 Acute diastolic (congestive) heart failure  History:        Patient has no prior history of Echocardiogram examinations.                 Risk Factors:Hypertension.  Sonographer:    Damien Senior RDCS Referring Phys: [id number] TIMOTHY S OPYD IMPRESSIONS  1. Left ventricular ejection fraction, by estimation, is 40 to 45%. Left ventricular ejection fraction by 2D MOD biplane is 43.0 %. The left ventricle has mildly decreased function. The left ventricle demonstrates regional wall motion abnormalities (see  scoring diagram/findings for description). The left ventricular internal cavity size was mildly dilated. There is mild concentric left ventricular hypertrophy. Left ventricular diastolic parameters are consistent with Grade III diastolic dysfunction (restrictive). Elevated left atrial  pressure. There is moderate hypokinesis of the left ventricular, basal-mid inferoseptal wall,  inferior wall and inferolateral wall.  2. Right ventricular systolic function is mildly reduced. The right ventricular size is moderately enlarged. Tricuspid regurgitation signal is inadequate for assessing PA pressure.  3. Left atrial size was moderately dilated.  4. The mitral valve is normal in structure. Trivial mitral valve regurgitation.  5. The aortic valve is tricuspid. Aortic valve regurgitation is not visualized.  6. The inferior vena cava is dilated in size with <50% respiratory variability, suggesting right atrial pressure of 15 mmHg. FINDINGS  Left Ventricle: Left ventricular ejection fraction, by estimation, is 40 to 45%. Left ventricular ejection fraction by 2D MOD biplane is 43.0 %. The left ventricle has mildly decreased function. The left ventricle demonstrates regional wall motion abnormalities. Moderate hypokinesis of the left ventricular, basal-mid inferoseptal wall, inferior wall and inferolateral wall. The left ventricular internal cavity size was mildly dilated. There is mild concentric left ventricular hypertrophy. Left ventricular diastolic parameters are consistent with Grade III diastolic dysfunction (restrictive). Elevated left atrial pressure. Right Ventricle: The right ventricular size is moderately enlarged. No increase in right ventricular wall thickness. Right ventricular systolic function is mildly reduced. Tricuspid regurgitation signal is inadequate for assessing PA pressure. Left Atrium: Left atrial size was moderately dilated. Right Atrium: Right atrial size was normal in size. Pericardium: There is no evidence of pericardial effusion. Mitral Valve: The mitral valve is normal in structure. Trivial mitral valve regurgitation. Tricuspid Valve: The tricuspid valve is normal in structure. Tricuspid valve regurgitation is trivial. Aortic Valve: The aortic valve is tricuspid. Aortic valve  regurgitation is not visualized. Pulmonic Valve: The pulmonic valve was grossly normal. Pulmonic valve regurgitation is not visualized. Aorta: The aortic root and ascending aorta are structurally normal, with no evidence of dilitation. Venous: The inferior vena cava is dilated in size with less than 50% respiratory variability, suggesting right atrial pressure of 15 mmHg. IAS/Shunts: No atrial level shunt detected by color flow Doppler.  LEFT VENTRICLE PLAX 2D                        Biplane EF (MOD) LVIDd:         5.80 cm         LV Biplane EF:   Left LVIDs:         4.20 cm                          ventricular LV PW:         1.20 cm                          ejection LV IVS:        1.40 cm                          fraction by LVOT diam:     2.20 cm                          2D MOD LV SV:         81                               biplane is LV SV Index:   36  43.0 %. LVOT Area:     3.80 cm LV IVRT:       87 msec         Diastology                                LV e' medial:    4.90 cm/s                                LV E/e' medial:  25.9 LV Volumes (MOD)               LV e' lateral:   4.24 cm/s LV vol d, MOD    149.0 ml      LV E/e' lateral: 30.0 A2C: LV vol d, MOD    170.0 ml A4C: LV vol s, MOD    87.6 ml A2C: LV vol s, MOD    96.0 ml A4C: LV SV MOD A2C:   61.4 ml LV SV MOD A4C:   170.0 ml LV SV MOD BP:    71.0 ml RIGHT VENTRICLE RV S prime:     10.90 cm/s TAPSE (M-mode): 1.8 cm LEFT ATRIUM              Index        RIGHT ATRIUM           Index LA diam:        6.00 cm  2.63 cm/m   RA Area:     23.30 cm LA Vol (A2C):   84.4 ml  37.03 ml/m  RA Volume:   76.90 ml  33.74 ml/m LA Vol (A4C):   109.0 ml 47.83 ml/m LA Biplane Vol: 97.1 ml  42.61 ml/m  AORTIC VALVE LVOT Vmax:   107.00 cm/s LVOT Vmean:  79.600 cm/s LVOT VTI:    0.214 m  AORTA Ao Root diam: 3.30 cm Ao Asc diam:  3.20 cm MITRAL VALVE MV Area (PHT): 3.85 cm     SHUNTS MV Decel Time: 197 msec     Systemic VTI:  0.21 m MV E  velocity: 127.00 cm/s  Systemic Diam: 2.20 cm MV A velocity: 48.40 cm/s MV E/A ratio:  2.62 Mihai Croitoru MD Electronically signed by Jerel Balding MD Signature Date/Time: 04/11/2024/9:18:51 AM    Final    CT Chest Wo Contrast Result Date: 04/11/2024 EXAM: CT CHEST WITHOUT CONTRAST 04/11/2024 01:50:27 AM TECHNIQUE: CT of the chest was performed without the administration of intravenous contrast. Multiplanar reformatted images are provided for review. Automated exposure control, iterative reconstruction, and/or weight based adjustment of the mA/kV was utilized to reduce the radiation dose to as low as reasonably achievable. COMPARISON: None available. CLINICAL HISTORY: Respiratory illness, nondiagnostic xray. FINDINGS: MEDIASTINUM: Cardiomegaly. Low-density blood pool compatible with anemia. Pericardium is unremarkable. The central airways are clear. LYMPH NODES: Shotty mediastinal lymph nodes likely reactive. Evaluation of hilar lymph nodes is limited without IV contrast. No axillary lymphadenopathy. LUNGS AND PLEURA: Diffuse interlobular septal thickening and bronchial wall thickening. Peribronchovascular patchy ground glass opacities. Small right and trace left pleural effusions. No pneumothorax. SOFT TISSUES/BONES: No acute abnormality of the bones or soft tissues. UPPER ABDOMEN: Limited images of the upper abdomen demonstrates no acute abnormality. IMPRESSION: 1. CHF with interstitial and alveolar edema. Small right and trace left pleural effusion. Electronically signed by: [name] MD 04/11/2024 02:00 AM EST RP Workstation: [id number]  CT Renal Stone Study Result Date: 04/11/2024 EXAM: CT UROGRAM 04/11/2024 01:50:27 AM TECHNIQUE: CT of the abdomen and pelvis was performed without the administration of intravenous contrast as per CT urogram protocol. Multiplanar reformatted images as well as MIP urogram images are provided for review. Automated exposure control, iterative reconstruction, and/or  weight based adjustment of the mA/kV was utilized to reduce the radiation dose to as low as reasonably achievable. COMPARISON: None available. CLINICAL HISTORY: Abdominal/flank pain, stone suspected. FINDINGS: LOWER CHEST: Small right pleural effusion. Interlobular septal thickening and patchy ground-glass opacities in the lower lung suspicious for edema. Cardiomegaly. LIVER: The liver is unremarkable. GALLBLADDER AND BILE DUCTS: Gallbladder is unremarkable. No biliary ductal dilatation. SPLEEN: No acute abnormality. PANCREAS: No acute abnormality. ADRENAL GLANDS: No acute abnormality. KIDNEYS, URETERS AND BLADDER: No stones in the kidneys or ureters. No hydronephrosis. Nonspecific symmetric perinephric stranding. Urinary bladder is unremarkable. GI AND BOWEL: Stomach demonstrates no acute abnormality. There is no bowel obstruction. Normal appendix. PERITONEUM AND RETROPERITONEUM: No ascites. No free air. Mesenteric edema. VASCULATURE: Aorta is normal in caliber. LYMPH NODES: No lymphadenopathy. REPRODUCTIVE ORGANS: No acute abnormality. BONES AND SOFT TISSUES: No acute osseous abnormality. Body wall anasarca. IMPRESSION: 1. Findings suggest congestive heart failure with 3rd spacing of fluid including pulmonary, mesenteric, and body wall edema and small right pleural effusion. Electronically signed by: [name] MD 04/11/2024 01:57 AM EST RP Workstation: [id number]    Family Communication: Discussed with patient, understand and agree. All questions answered.  Disposition: Status is: Inpatient Remains inpatient appropriate because: IV lasix therapy, follow kidney function, electrolytes, nephrology and cardiology.  Planned Discharge Destination: Home     Time spent: 51 minutes  Author: Concepcion Riser, MD 04/12/2024 1:47 PM Secure chat 7am to 7pm For on call review [email or web address].

## 2024-04-12 NOTE — Progress Notes (Signed)
 Rounding Note   Patient Name: Kenneth Hart Date of Encounter: 04/12/2024  [facility] Cardiologist: Annabella Scarce, MD (new)  Subjective Breathing improving with diuresis. No CP.  Scheduled Meds:  darbepoetin (ARANESP) injection - DIALYSIS  60 mcg Subcutaneous Q Wed-1800   [START ON 04/13/2024] ferrous sulfate  325 mg Oral Q breakfast   furosemide  80 mg Intravenous BID   heparin  5,000 Units Subcutaneous Q8H   magnesium oxide  400 mg Oral Daily   metoprolol succinate  25 mg Oral Daily   sevelamer carbonate  800 mg Oral TID WC   sodium chloride flush  3 mL Intravenous Q12H   Continuous Infusions:  PRN Meds: acetaminophen **OR** acetaminophen, fentaNYL (SUBLIMAZE) injection, oxyCODONE, senna, trimethobenzamide   Vital Signs  Vitals:   04/11/24 2226 04/12/24 0442 04/12/24 0500 04/12/24 0826  BP: (!) 138/91 128/85  136/86  Pulse: 84 83  79  Resp:  17  19  Temp: 98.5 F (36.9 C) 98.2 F (36.8 C)  98.7 F (37.1 C)  TempSrc:    Oral  SpO2: 96% 94%  97%  Weight:   112.5 kg   Height:   5' 7 (1.702 m)     Intake/Output Summary (Last 24 hours) at 04/12/2024 1513 Last data filed at 04/12/2024 0900 Gross per 24 hour  Intake 118 ml  Output 1800 ml  Net -1682 ml      04/12/2024    5:00 AM  Last 3 Weights  Weight (lbs) 248 lb 0.3 oz  Weight (kg) 112.5 kg      Telemetry Sinus rhythm.  PVCs.  Up to 33 beats VT - Personally Reviewed  ECG  Sinus rhtyhm.  Rate 93 bpm.  Inferolateral TWI.  QTc 508 ms - Personally Reviewed  Physical Exam  VS:  BP 136/86 (BP Location: Right Arm)   Pulse 79   Temp 98.7 F (37.1 C) (Oral)   Resp 19   Ht 5' 7 (1.702 m)   Wt 112.5 kg   SpO2 97%   BMI 38.85 kg/m  , BMI Body mass index is 38.85 kg/m. GENERAL:  Well appearing HEENT: Pupils equal round and reactive, fundi not visualized, oral mucosa unremarkable NECK:  No jugular venous distention, waveform within normal limits, carotid upstroke brisk and symmetric,  no bruits, no thyromegaly LUNGS:  Clear to auscultation bilaterally HEART:  RRR.  PMI not displaced or sustained,S1 and S2 within normal limits, no S3, no S4, no clicks, no rubs, no murmurs ABD:  Flat, positive bowel sounds normal in frequency in pitch, no bruits, no rebound, no guarding, no midline pulsatile mass, no hepatomegaly, no splenomegaly EXT:  2 plus pulses throughout, 1+ LE edema, no cyanosis no clubbing SKIN:  No rashes no nodules NEURO:  Cranial nerves II through XII grossly intact, motor grossly intact throughout PSYCH:  Cognitively intact, oriented to person place and time    Labs High Sensitivity Troponin:  No results for input(s): TROPONINIHS in the last 720 hours.   Chemistry Recent Labs  Lab 04/10/24 2305 04/11/24 0058 04/11/24 0106 04/11/24 0350 04/12/24 0346  NA 142  --  142 139 143  K 3.3*  --  3.3* 3.4* 4.2  CL 103  --  107 107 108  CO2 23  --   --  22 23  GLUCOSE 132*  --  104* 88 96  BUN 45*  --  47* 47* 49*  CREATININE 5.73*  --  6.00* 5.63* 6.20*  CALCIUM 5.0*  --   --  4.9* 5.2*  MG  --  1.3*  --  1.3* 1.5*  PROT 6.2*  --   --   --   --   ALBUMIN 2.7*  --   --   --   --   AST 20  --   --   --   --   ALT 27  --   --   --   --   ALKPHOS 88  --   --   --   --   BILITOT 0.2  --   --   --   --   GFRNONAA 11*  --   --  11* 10*  ANIONGAP 16*  --   --  10 12    Lipids  Recent Labs  Lab 04/12/24 0346  CHOL 158  TRIG 43  HDL 48  LDLCALC 101*  CHOLHDL 3.3    Hematology Recent Labs  Lab 04/10/24 2305 04/11/24 0106 04/11/24 0350 04/12/24 0346  WBC 13.6*  --  11.0* 10.0  RBC 3.65*  --  3.47*  3.46* 3.48*  HGB 9.0* 10.5* 8.5* 8.5*  HCT 30.9* 31.0* 29.3* 29.3*  MCV 84.7  --  84.4 84.2  MCH 24.7*  --  24.5* 24.4*  MCHC 29.1*  --  29.0* 29.0*  RDW 19.1*  --  19.1* 19.1*  PLT 336  --  309 317   Thyroid  Recent Labs  Lab 04/11/24 1635  TSH 1.283    BNP Recent Labs  Lab 04/11/24 0058  BNP 1,608.5*    DDimer No results for  input(s): DDIMER in the last 168 hours.   Radiology  ECHOCARDIOGRAM COMPLETE Result Date: 04/11/2024    ECHOCARDIOGRAM REPORT   Patient Name:   Kenneth Hart Date of Exam: 04/11/2024 Medical Rec #:  [id number]        Height:       67.0 in Accession #:    [id number]       Weight:       265.0 lb Date of Birth:  February 05, 1966        BSA:          2.279 m Patient Age:    58 years         BP:           146/88 mmHg Patient Gender: M                HR:           88 bpm. Exam Location:  Inpatient Procedure: 2D Echo, Cardiac Doppler and Color Doppler (Both Spectral and Color            Flow Doppler were utilized during procedure). Indications:    I50.31 Acute diastolic (congestive) heart failure  History:        Patient has no prior history of Echocardiogram examinations.                 Risk Factors:Hypertension.  Sonographer:    Damien Senior RDCS Referring Phys: [id number] TIMOTHY S OPYD IMPRESSIONS  1. Left ventricular ejection fraction, by estimation, is 40 to 45%. Left ventricular ejection fraction by 2D MOD biplane is 43.0 %. The left ventricle has mildly decreased function. The left ventricle demonstrates regional wall motion abnormalities (see  scoring diagram/findings for description). The left ventricular internal cavity size was mildly dilated. There is mild concentric left ventricular hypertrophy. Left ventricular diastolic parameters are consistent with Grade III diastolic dysfunction (restrictive). Elevated left atrial pressure. There  is moderate hypokinesis of the left ventricular, basal-mid inferoseptal wall, inferior wall and inferolateral wall.  2. Right ventricular systolic function is mildly reduced. The right ventricular size is moderately enlarged. Tricuspid regurgitation signal is inadequate for assessing PA pressure.  3. Left atrial size was moderately dilated.  4. The mitral valve is normal in structure. Trivial mitral valve regurgitation.  5. The aortic valve is tricuspid. Aortic valve  regurgitation is not visualized.  6. The inferior vena cava is dilated in size with <50% respiratory variability, suggesting right atrial pressure of 15 mmHg. FINDINGS  Left Ventricle: Left ventricular ejection fraction, by estimation, is 40 to 45%. Left ventricular ejection fraction by 2D MOD biplane is 43.0 %. The left ventricle has mildly decreased function. The left ventricle demonstrates regional wall motion abnormalities. Moderate hypokinesis of the left ventricular, basal-mid inferoseptal wall, inferior wall and inferolateral wall. The left ventricular internal cavity size was mildly dilated. There is mild concentric left ventricular hypertrophy. Left ventricular diastolic parameters are consistent with Grade III diastolic dysfunction (restrictive). Elevated left atrial pressure. Right Ventricle: The right ventricular size is moderately enlarged. No increase in right ventricular wall thickness. Right ventricular systolic function is mildly reduced. Tricuspid regurgitation signal is inadequate for assessing PA pressure. Left Atrium: Left atrial size was moderately dilated. Right Atrium: Right atrial size was normal in size. Pericardium: There is no evidence of pericardial effusion. Mitral Valve: The mitral valve is normal in structure. Trivial mitral valve regurgitation. Tricuspid Valve: The tricuspid valve is normal in structure. Tricuspid valve regurgitation is trivial. Aortic Valve: The aortic valve is tricuspid. Aortic valve regurgitation is not visualized. Pulmonic Valve: The pulmonic valve was grossly normal. Pulmonic valve regurgitation is not visualized. Aorta: The aortic root and ascending aorta are structurally normal, with no evidence of dilitation. Venous: The inferior vena cava is dilated in size with less than 50% respiratory variability, suggesting right atrial pressure of 15 mmHg. IAS/Shunts: No atrial level shunt detected by color flow Doppler.  LEFT VENTRICLE PLAX 2D                         Biplane EF (MOD) LVIDd:         5.80 cm         LV Biplane EF:   Left LVIDs:         4.20 cm                          ventricular LV PW:         1.20 cm                          ejection LV IVS:        1.40 cm                          fraction by LVOT diam:     2.20 cm                          2D MOD LV SV:         81                               biplane is LV SV Index:   36  43.0 %. LVOT Area:     3.80 cm LV IVRT:       87 msec         Diastology                                LV e' medial:    4.90 cm/s                                LV E/e' medial:  25.9 LV Volumes (MOD)               LV e' lateral:   4.24 cm/s LV vol d, MOD    149.0 ml      LV E/e' lateral: 30.0 A2C: LV vol d, MOD    170.0 ml A4C: LV vol s, MOD    87.6 ml A2C: LV vol s, MOD    96.0 ml A4C: LV SV MOD A2C:   61.4 ml LV SV MOD A4C:   170.0 ml LV SV MOD BP:    71.0 ml RIGHT VENTRICLE RV S prime:     10.90 cm/s TAPSE (M-mode): 1.8 cm LEFT ATRIUM              Index        RIGHT ATRIUM           Index LA diam:        6.00 cm  2.63 cm/m   RA Area:     23.30 cm LA Vol (A2C):   84.4 ml  37.03 ml/m  RA Volume:   76.90 ml  33.74 ml/m LA Vol (A4C):   109.0 ml 47.83 ml/m LA Biplane Vol: 97.1 ml  42.61 ml/m  AORTIC VALVE LVOT Vmax:   107.00 cm/s LVOT Vmean:  79.600 cm/s LVOT VTI:    0.214 m  AORTA Ao Root diam: 3.30 cm Ao Asc diam:  3.20 cm MITRAL VALVE MV Area (PHT): 3.85 cm     SHUNTS MV Decel Time: 197 msec     Systemic VTI:  0.21 m MV E velocity: 127.00 cm/s  Systemic Diam: 2.20 cm MV A velocity: 48.40 cm/s MV E/A ratio:  2.62 Mihai Croitoru MD Electronically signed by Jerel Balding MD Signature Date/Time: 04/11/2024/9:18:51 AM    Final    CT Chest Wo Contrast Result Date: 04/11/2024 EXAM: CT CHEST WITHOUT CONTRAST 04/11/2024 01:50:27 AM TECHNIQUE: CT of the chest was performed without the administration of intravenous contrast. Multiplanar reformatted images are provided for review. Automated exposure control,  iterative reconstruction, and/or weight based adjustment of the mA/kV was utilized to reduce the radiation dose to as low as reasonably achievable. COMPARISON: None available. CLINICAL HISTORY: Respiratory illness, nondiagnostic xray. FINDINGS: MEDIASTINUM: Cardiomegaly. Low-density blood pool compatible with anemia. Pericardium is unremarkable. The central airways are clear. LYMPH NODES: Shotty mediastinal lymph nodes likely reactive. Evaluation of hilar lymph nodes is limited without IV contrast. No axillary lymphadenopathy. LUNGS AND PLEURA: Diffuse interlobular septal thickening and bronchial wall thickening. Peribronchovascular patchy ground glass opacities. Small right and trace left pleural effusions. No pneumothorax. SOFT TISSUES/BONES: No acute abnormality of the bones or soft tissues. UPPER ABDOMEN: Limited images of the upper abdomen demonstrates no acute abnormality. IMPRESSION: 1. CHF with interstitial and alveolar edema. Small right and trace left pleural effusion. Electronically signed by: [name] MD 04/11/2024 02:00 AM EST RP Workstation: [id number]  CT Renal Stone Study Result Date: 04/11/2024 EXAM: CT UROGRAM 04/11/2024 01:50:27 AM TECHNIQUE: CT of the abdomen and pelvis was performed without the administration of intravenous contrast as per CT urogram protocol. Multiplanar reformatted images as well as MIP urogram images are provided for review. Automated exposure control, iterative reconstruction, and/or weight based adjustment of the mA/kV was utilized to reduce the radiation dose to as low as reasonably achievable. COMPARISON: None available. CLINICAL HISTORY: Abdominal/flank pain, stone suspected. FINDINGS: LOWER CHEST: Small right pleural effusion. Interlobular septal thickening and patchy ground-glass opacities in the lower lung suspicious for edema. Cardiomegaly. LIVER: The liver is unremarkable. GALLBLADDER AND BILE DUCTS: Gallbladder is unremarkable. No biliary ductal  dilatation. SPLEEN: No acute abnormality. PANCREAS: No acute abnormality. ADRENAL GLANDS: No acute abnormality. KIDNEYS, URETERS AND BLADDER: No stones in the kidneys or ureters. No hydronephrosis. Nonspecific symmetric perinephric stranding. Urinary bladder is unremarkable. GI AND BOWEL: Stomach demonstrates no acute abnormality. There is no bowel obstruction. Normal appendix. PERITONEUM AND RETROPERITONEUM: No ascites. No free air. Mesenteric edema. VASCULATURE: Aorta is normal in caliber. LYMPH NODES: No lymphadenopathy. REPRODUCTIVE ORGANS: No acute abnormality. BONES AND SOFT TISSUES: No acute osseous abnormality. Body wall anasarca. IMPRESSION: 1. Findings suggest congestive heart failure with 3rd spacing of fluid including pulmonary, mesenteric, and body wall edema and small right pleural effusion. Electronically signed by: [name] MD 04/11/2024 01:57 AM EST RP Workstation: [id number]    Cardiac Studies Echo 04/11/24:  1. Left ventricular ejection fraction, by estimation, is 40 to 45%. Left  ventricular ejection fraction by 2D MOD biplane is 43.0 %. The left  ventricle has mildly decreased function. The left ventricle demonstrates  regional wall motion abnormalities (see   scoring diagram/findings for description). The left ventricular internal  cavity size was mildly dilated. There is mild concentric left ventricular  hypertrophy. Left ventricular diastolic parameters are consistent with  Grade III diastolic dysfunction  (restrictive). Elevated left atrial pressure. There is moderate  hypokinesis of the left ventricular, basal-mid inferoseptal wall, inferior  wall and inferolateral wall.   2. Right ventricular systolic function is mildly reduced. The right  ventricular size is moderately enlarged. Tricuspid regurgitation signal is  inadequate for assessing PA pressure.   3. Left atrial size was moderately dilated.   4. The mitral valve is normal in structure. Trivial mitral valve   regurgitation.   5. The aortic valve is tricuspid. Aortic valve regurgitation is not  visualized.   6. The inferior vena cava is dilated in size with <50% respiratory  variability, suggesting right atrial pressure of 15 mmHg.   Patient Profile   Mr. Hawthorne is a 89M with untreated hypertension and tobacco abuse admitted with newly diagnosed HFrEF and CKD.   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  There is focal wall motion abnormality in the basal to mid inferoseptal, inferior and inferolateral myocardium. He is not currently a candidate for cath given his renal dysfunction.  He remains chest pian free.  Consider stress MRI which could also help to evaluate for evidence of infiltrative cardiomyopathy.  He is having runs of NSVT which may be due to ischemia or electrolyte abnormalities.  Will increase metoprolol succinate to 50 mg daily.  Continue to supplement magnesium to maintain >2.  Attempts at volume removal per nephrology. TSH was wnl.  HR is in the  upper 90s making tachycardia another possible etiology of his heart failure.    # CKD:  Creatinine 6 on admission with multiple electrolyte  abnormalities.  He is being evaluated by nephrology.  Etiology unclear.  It seems that his hypertension is out of proportion with the degree of renal dysfunction. Unclear whether he will need renal biopsy.    # Tobacco: Advise cessation.    # HTN:  BP labile.  Most recently in the 120s.  Adding low dose metoprolol as above.       For questions or updates, please contact [facility] Please consult [email or web address] for contact info under       Signed, Annabella Scarce, MD  04/12/2024, 3:13 PM

## 2024-04-12 NOTE — Progress Notes (Signed)
 Ok to start ferrous sulfate 325mg  qday after IV today per Dr. Darci.  Sergio Batch, PharmD, BCIDP, AAHIVP, CPP Infectious Disease Pharmacist 04/12/2024 8:29 AM

## 2024-04-12 NOTE — Plan of Care (Signed)

## 2024-04-13 DIAGNOSIS — I5021 Acute systolic (congestive) heart failure: Secondary | ICD-10-CM | POA: Diagnosis not present

## 2024-04-13 DIAGNOSIS — I472 Ventricular tachycardia, unspecified: Secondary | ICD-10-CM | POA: Diagnosis not present

## 2024-04-13 DIAGNOSIS — N19 Unspecified kidney failure: Secondary | ICD-10-CM | POA: Diagnosis not present

## 2024-04-13 DIAGNOSIS — N185 Chronic kidney disease, stage 5: Secondary | ICD-10-CM | POA: Diagnosis not present

## 2024-04-13 DIAGNOSIS — E876 Hypokalemia: Secondary | ICD-10-CM | POA: Diagnosis not present

## 2024-04-13 LAB — BASIC METABOLIC PANEL WITH GFR
Anion gap: 13 (ref 5–15)
BUN: 50 mg/dL — ABNORMAL HIGH (ref 6–20)
CO2: 23 mmol/L (ref 22–32)
Calcium: 5.1 mg/dL — CL (ref 8.9–10.3)
Chloride: 104 mmol/L (ref 98–111)
Creatinine, Ser: 6.37 mg/dL — ABNORMAL HIGH (ref 0.61–1.24)
GFR, Estimated: 10 mL/min — ABNORMAL LOW (ref >60)
Glucose, Bld: 85 mg/dL (ref 70–99)
Potassium: 3.5 mmol/L (ref 3.5–5.1)
Sodium: 140 mmol/L (ref 135–145)

## 2024-04-13 LAB — CBC
HCT: 28.7 % — ABNORMAL LOW (ref 39.0–52.0)
Hemoglobin: 8.4 g/dL — ABNORMAL LOW (ref 13.0–17.0)
MCH: 24.4 pg — ABNORMAL LOW (ref 26.0–34.0)
MCHC: 29.3 g/dL — ABNORMAL LOW (ref 30.0–36.0)
MCV: 83.4 fL (ref 80.0–100.0)
Platelets: 291 K/uL (ref 150–400)
RBC: 3.44 MIL/uL — ABNORMAL LOW (ref 4.22–5.81)
RDW: 19.2 % — ABNORMAL HIGH (ref 11.5–15.5)
WBC: 9 K/uL (ref 4.0–10.5)
nRBC: 0 % (ref 0.0–0.2)

## 2024-04-13 LAB — MICROALBUMIN / CREATININE URINE RATIO
Creatinine, Urine: 76.4 mg/dL
Microalb Creat Ratio: 83 mg/g{creat} — ABNORMAL HIGH (ref 0–29)
Microalb, Ur: 63.4 ug/mL — ABNORMAL HIGH

## 2024-04-13 LAB — MAGNESIUM: Magnesium: 1.5 mg/dL — ABNORMAL LOW (ref 1.7–2.4)

## 2024-04-13 LAB — KAPPA/LAMBDA LIGHT CHAINS
Kappa free light chain: 246.9 mg/L — ABNORMAL HIGH (ref 3.3–19.4)
Kappa, lambda light chain ratio: 2.25 — ABNORMAL HIGH (ref 0.26–1.65)
Lambda free light chains: 109.9 mg/L — ABNORMAL HIGH (ref 5.7–26.3)

## 2024-04-13 LAB — ANA W/REFLEX IF POSITIVE: Anti Nuclear Antibody (ANA): NEGATIVE

## 2024-04-13 MED ORDER — CALCIUM ACETATE (PHOS BINDER) 667 MG PO CAPS
667.0000 mg | ORAL_CAPSULE | Freq: Three times a day (TID) | ORAL | Status: DC
  Administered 2024-04-13 – 2024-04-15 (×5): 667 mg via ORAL
  Filled 2024-04-13 (×6): qty 1

## 2024-04-13 MED ORDER — FUROSEMIDE 40 MG PO TABS
40.0000 mg | ORAL_TABLET | Freq: Two times a day (BID) | ORAL | Status: DC
  Administered 2024-04-13: 40 mg via ORAL
  Filled 2024-04-13: qty 1

## 2024-04-13 MED ORDER — CALCIUM GLUCONATE-NACL 2-0.675 GM/100ML-% IV SOLN
2.0000 g | Freq: Once | INTRAVENOUS | Status: AC
  Administered 2024-04-13: 2000 mg via INTRAVENOUS
  Filled 2024-04-13: qty 100

## 2024-04-13 MED ORDER — MAGNESIUM SULFATE IN D5W 1-5 GM/100ML-% IV SOLN
1.0000 g | Freq: Once | INTRAVENOUS | Status: AC
  Administered 2024-04-13: 1 g via INTRAVENOUS
  Filled 2024-04-13: qty 100

## 2024-04-13 NOTE — Progress Notes (Signed)
   04/13/24 0457  Provider Notification  Provider Name/Title Opyd,MD  Date Provider Notified 04/13/24  Time Provider Notified 0457  Method of Notification Page  Notification Reason Critical Result  Test performed and critical result Calcium level= 5.1  Date Critical Result Received 04/13/24  Time Critical Result Received 0449  Provider response See new orders  Date of Provider Response 04/13/24  Time of Provider Response [phone]

## 2024-04-13 NOTE — Progress Notes (Signed)
 Rounding Note   Patient Name: Kenneth Hart Date of Encounter: 04/13/2024  Kenneth Hart Cardiologist: Kenneth Scarce, MD (new)  Subjective Breathing improving with diuresis. No CP.  Scheduled Meds:  calcium acetate  667 mg Oral TID WC   darbepoetin (ARANESP) injection - DIALYSIS  60 mcg Subcutaneous Q Wed-1800   ferrous sulfate  325 mg Oral Q breakfast   furosemide  40 mg Oral BID   heparin  5,000 Units Subcutaneous Q8H   metoprolol succinate  50 mg Oral Daily   sodium chloride flush  3 mL Intravenous Q12H   Continuous Infusions:  PRN Meds: acetaminophen **OR** acetaminophen, fentaNYL (SUBLIMAZE) injection, oxyCODONE, senna, trimethobenzamide   Vital Signs  Vitals:   04/12/24 2022 04/13/24 0454 04/13/24 0909 04/13/24 1631  BP: 135/88 121/69 (!) 138/91 119/79  Pulse: 84 77 74 72  Resp: 17 17 13 18   Temp: 98.2 F (36.8 C) 98.6 F (37 C) 98.6 F (37 C) 98.4 F (36.9 C)  TempSrc: Oral Oral Oral   SpO2: 94% 94% 97% 91%  Weight:      Height:        Intake/Output Summary (Last 24 hours) at 04/13/2024 1646 Last data filed at 04/13/2024 1444 Gross per 24 hour  Intake 640 ml  Output 1250 ml  Net -610 ml      04/12/2024    5:00 AM  Last 3 Weights  Weight (lbs) 248 lb 0.3 oz  Weight (kg) 112.5 kg      Telemetry Sinus rhythm.  PVCs.  Up to 33 beats VT - Personally Reviewed  ECG  Sinus rhtyhm.  Rate 93 bpm.  Inferolateral TWI.  QTc 508 ms - Personally Reviewed  Physical Exam  VS:  BP 119/79   Pulse 72   Temp 98.4 F (36.9 C)   Resp 18   Ht 5' 7 (1.702 m)   Wt 112.5 kg   SpO2 91%   BMI 38.85 kg/m  , BMI Body mass index is 38.85 kg/m. GENERAL:  Well appearing HEENT: Pupils equal round and reactive, fundi not visualized, oral mucosa unremarkable NECK:  No jugular venous distention, waveform within normal limits, carotid upstroke brisk and symmetric, no bruits, no thyromegaly LUNGS:  Clear to auscultation bilaterally HEART:  RRR.  PMI  not displaced or sustained,S1 and S2 within normal limits, no S3, no S4, no clicks, no rubs, no murmurs ABD:  Flat, positive bowel sounds normal in frequency in pitch, no bruits, no rebound, no guarding, no midline pulsatile mass, no hepatomegaly, no splenomegaly EXT:  2 plus pulses throughout, 1+ LE edema, no cyanosis no clubbing SKIN:  No rashes no nodules NEURO:  Cranial nerves II through XII grossly intact, motor grossly intact throughout PSYCH:  Cognitively intact, oriented to person place and time    Labs High Sensitivity Troponin:  No results for input(s): TROPONINIHS in the last 720 hours.   Chemistry Recent Labs  Lab 04/10/24 2305 04/11/24 0058 04/11/24 0350 04/12/24 0346 04/13/24 0348  NA 142   < > 139 143 140  K 3.3*   < > 3.4* 4.2 3.5  CL 103   < > 107 108 104  CO2 23  --  22 23 23   GLUCOSE 132*   < > 88 96 85  BUN 45*   < > 47* 49* 50*  CREATININE 5.73*   < > 5.63* 6.20* 6.37*  CALCIUM 5.0*  --  4.9* 5.2* 5.1*  MG  --    < > 1.3*  1.5* 1.5*  PROT 6.2*  --   --   --   --   ALBUMIN 2.7*  --   --   --   --   AST 20  --   --   --   --   ALT 27  --   --   --   --   ALKPHOS 88  --   --   --   --   BILITOT 0.2  --   --   --   --   GFRNONAA 11*  --  11* 10* 10*  ANIONGAP 16*  --  10 12 13    < > = values in this interval not displayed.    Lipids  Recent Labs  Lab 04/12/24 0346  CHOL 158  TRIG 43  HDL 48  LDLCALC 101*  CHOLHDL 3.3    Hematology Recent Labs  Lab 04/11/24 0350 04/12/24 0346 04/13/24 0348  WBC 11.0* 10.0 9.0  RBC 3.47*  3.46* 3.48* 3.44*  HGB 8.5* 8.5* 8.4*  HCT 29.3* 29.3* 28.7*  MCV 84.4 84.2 83.4  MCH 24.5* 24.4* 24.4*  MCHC 29.0* 29.0* 29.3*  RDW 19.1* 19.1* 19.2*  PLT 309 317 291   Thyroid  Recent Labs  Lab 04/11/24 1635  TSH 1.283    BNP Recent Labs  Lab 04/11/24 0058  BNP 1,608.5*    DDimer No results for input(s): DDIMER in the last 168 hours.   Radiology  No results found.   Cardiac Studies Echo  04/11/24:  1. Left ventricular ejection fraction, by estimation, is 40 to 45%. Left  ventricular ejection fraction by 2D MOD biplane is 43.0 %. The left  ventricle has mildly decreased function. The left ventricle demonstrates  regional wall motion abnormalities (see   scoring diagram/findings for description). The left ventricular internal  cavity size was mildly dilated. There is mild concentric left ventricular  hypertrophy. Left ventricular diastolic parameters are consistent with  Grade III diastolic dysfunction  (restrictive). Elevated left atrial pressure. There is moderate  hypokinesis of the left ventricular, basal-mid inferoseptal wall, inferior  wall and inferolateral wall.   2. Right ventricular systolic function is mildly reduced. The right  ventricular size is moderately enlarged. Tricuspid regurgitation signal is  inadequate for assessing PA pressure.   3. Left atrial size was moderately dilated.   4. The mitral valve is normal in structure. Trivial mitral valve  regurgitation.   5. The aortic valve is tricuspid. Aortic valve regurgitation is not  visualized.   6. The inferior vena cava is dilated in size with <50% respiratory  variability, suggesting right atrial pressure of 15 mmHg.   Patient Profile   Kenneth Hart is a 65M with untreated hypertension and tobacco abuse admitted with newly diagnosed HFrEF and CKD.   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  There is focal wall motion abnormality in the basal to mid inferoseptal, inferior and inferolateral myocardium. He is not currently a candidate for cath given his renal dysfunction.  He remains chest pian free.  He is having runs of NSVT which may be due to ischemia or electrolyte abnormalities. This persists despite increasing metoprolol succinate to 50 mg daily.  Continue to supplement magnesium to maintain >2.  K>4.  Volume is being removed per nephrology. TSH was wnl.  Recommend getting a Lexiscan Myoview tomorrow  to ensure that we aren't dealing with a large region of ischemia prior to discharging him.   Informed Consent  Shared Decision Making/Informed Consent The risks [chest pain, shortness of breath, cardiac arrhythmias, dizziness, blood pressure fluctuations, myocardial infarction, stroke/transient ischemic attack, nausea, vomiting, allergic reaction, radiation exposure, metallic taste sensation and life-threatening complications (estimated to be 1 in 10,000)], benefits (risk stratification, diagnosing coronary artery disease, treatment guidance) and alternatives of a nuclear stress test were discussed in detail with Mr. Krieger and he agrees to proceed.    # CKD:  Creatinine 6 on admission with multiple electrolyte abnormalities.  He is being evaluated by nephrology.  Etiology unclear.  It seems that his hypertension is out of proportion with the degree of renal dysfunction. Unclear whether he will need renal biopsy.    # Tobacco: Advise cessation.    # HTN:  BP labile.  Most recently in the 120s.  Adding low dose metoprolol as above.       For questions or updates, please contact [facility] Please consult [email or web address] for contact info under       Signed, Kenneth Scarce, MD  04/13/2024, 4:46 PM

## 2024-04-13 NOTE — Discharge Instructions (Signed)
 Rent/Utility Assistance in [facility]:  [facility] [address], [place], [place] [address] [phone] Mon 8:00am - 6:00pm; Tue 8:00am - 6:00pm; Wed 8:00am - 6:00pm; Thu 8:00am - 6:00pm; Fri 8:00am - 6:00pm; Email: [email or web address] Eligibility: Residents of [place], [place], [place], [place], [place] and [place] that meet income limits. Call or text for eligibility screening.   [facility] [address] [place], [place], [place] [address] [phone] (Main: Rental Assistance) [phone] (Main: Utility Assistance) Mon 8:30am - 5:00pm; Tue 8:30am - 5:00pm; Wed 8:30am - 5:00pm; Thu 8:30am - 5:00pm; Fri 8:30am - 5:00pm; Website: http://www.greensborourbanministry.org/emergency-assistance-program Eligibility: People who have an unexpected crisis or emergency that can be verified. Must have some form of income and meet income limits. At the first of the month, only helps with rent/mortgage assistance for those who have court ordered eviction notices. Call for application information. Call for exact documents that will be needed. Examples of documents that may be needed: Photo ID, Social Security cards for everyone in the household, and proof of income for previous 2 months. Copy of eviction notice for rent assistance and copy of final notice for utility assistance. Statements or receipts of bills for previous 2 months.   [facility] [place] [address], [place], [place] [address] [phone] (Main) [phone] (Alternate) Mon 9:00am - 5:00pm; Tue 9:00am - 5:00pm; Wed 9:00am - 5:00pm; Thu 9:00am - 5:00pm; Fri 9:00am - 5:00pm; Website: [email or web address].[email or web address]/emergency-financial-assistance Email: [email or web address] Eligibility: People experiencing a housing crisis with past-due rent and/or utilities and meet income limits. Must be willing to take part in 6 Call or visit  website to download application. Return complete application by mail or email only. Documents: Help with Utilities: Photo ID, proof of household income, copies of monthly bills or receipts, and a final disconnection/shut-off notice. Help with Rent or Mortgage: Photo ID, proof of income, copies of monthly bills or receipts, and eviction notice. Help with Household Goods: Photo ID, proof of household income, copies of monthly bills or receipts, and a fire or flood report.  [facility] [place] [address] [place], [place] [address] [phone] (Main) Mon 8:00am - 5:00pm; Tue 8:00am - 5:00pm; Wed 8:00am - 5:00pm; Thu 8:00am - 5:00pm; Fri 8:00am - 12:00pm; Website: http://[email or web address].salvationarmy.org/[email or web address]/emergency-financial-assistance Email: [email or web address] Call for eligibility information. Apply :Utilities Assistance: Visit office by 8:30am on 1st and 4th Monday of each month to pick up application. Rent and Mortgage Assistance: Visit office by 8:30am on 2nd and 3rd Monday of each month to pick up application. NOTE: If Monday falls on a holiday applications can be picked up the following Tuesday. Documents required will be listed on application.  [facility] [place] [phone] (Main) Seen by appointment only. Call for more information. Eligibility: Meet income limits. Apply: Call for information on how to schedule an appointment. Each month there is a specific day to call to schedule an appointment. It is stated on the agency voicemail message. Appointments fill up quickly each month. Documents: Photo ID, copy of current utility bill.  [facility] [place] [address] [place], [place] [address] [phone] (Main) Tue 9:00am - 4:00pm; Wed 9:00am - 4:00pm; Thu 9:00am - 4:00pm; Website: http://www.[email or web address] Email: [email or web address] [email or web address] Eligibility: Utility Assistance: Meet income  limits and be a Holiday representative. [employer] customers do not qualify. Must not have received utility assistance for another agency within the last 90 days. Rent Assistance: Residents of [employer] who  meet income limits. Must not have received rent assistance for another agency within the last 90 days. Apply: Call to schedule an appointment. Documents: Utility Assistance: Photo ID, [place] of [employer], copy of lease (if not paying a mortgage), proof of income, and monthly expenses. Rent Assistance: Photo ID, W-9 from the landlord, copy of the lease, proof of income, and a list of monthly expenses.  [facility] [place] [address], [place], [place] [address] [phone] (Main: Help With Rent) [phone] (Main: Help With Utilities) Mon 9:00am - 4:00pm; Tue 9:00am - 4:00pm; Wed 9:00am - 4:00pm; Thu 9:00am - 4:00pm; Fri 9:00am - 4:00pm; Website: [email or web address].no Email: [email or web address] Eligibility: People experiencing a financial crisis. Apply: Call to schedule an appointment Wednesday, 7:30am. Documents: Photo ID, Social Security card, proof of income, and proof of address. Other documents may be required, depending on service. Call for more information.  LOW INCOME ENERGY ASSISTANCE PROGRAM DEPARTMENT OF SOCIAL SERVICES - [facility] [address], [place], [place] [address] [phone] (Main) Mon 8:00am - 5:00pm; Tue 8:00am - 5:00pm; Wed 8:00am - 5:00pm; Thu 8:00am - 5:00pm; Fri 8:00am - 5:00pm; Website: [email or web address] Eligibility: Meet income limits and resource guidelines. Each household is only eligible once, even if multiple members apply. Apply: Call to see if funds are available. Visit to complete an application, call to have 1 mailed, or apply online at [email or web address] NOTE: Households  with a person age 23 and over or a person with a documented disability can apply beginning December 1. Other households can apply beginning January 1. Documents: Photo ID, birth certificate, proof of household income, copy of utility bill, latest bank statement, the names and Social Security numbers for everyone in the household, and proof of disability if under age 14.  LOW INCOME ENERGY ASSISTANCE PROGRAM DEPARTMENT OF SOCIAL SERVICES - [facility] [address] [place], [place], [place] [address] [phone] (Main) Mon 8:00am - 5:00pm; Tue 8:00am - 5:00pm; Wed 8:00am - 5:00pm; Thu 8:00am - 5:00pm; Fri 8:00am - 5:00pm; Website: [email or web address] Eligibility: Meet income limits and resource guidelines. Each household is only eligible once, even if multiple members apply. Apply: Call to see if funds are available. Visit to complete an application, call to have 1 mailed, or apply online at [email or web address] NOTE: Households with a person age 46 and over or a person with a documented disability can apply beginning December 1. Other households can apply beginning January 1. Documents: Photo ID, birth certificate, proof of household income, copy of utility bill, latest bank statement, the names and Social Security numbers for everyone in the household, and proof of disability if under age 86.     [place]  [facility] Address: [address] [place], [place] [address] Phone Number: [phone] Hours of Operation: Residents of [place] can come to obtain food Monday through Friday from 8:30am until 3:30pm. Photo ID and Social Security cards required for all residents of a household. Can come six times a year  [facility] Address: [address] [place], [place] [address] Phone Number: [phone] Hours of Operation: Operates Tuesday-Friday 10:00 a.m. to 1 p.m. Requirements: Referral from DSS  needed. May come 6 times a year, 30 days apart. Photo ID and SS required for all residents of household.  [facility] Address: [address] [place], [place] [address] Phone Number: [phone] Hours of Operation: Food pantry is open on the last Saturday of each month from 10:00 am - 12:00 noon.  No appointment needed. No qualifications.  [facility] Address: [address] [place], [place] [address] Phone Number: [phone]. 21 Hours of Operation: Must make reservations to pick up food on Saturdays. Sign ups for Saturday pick up beginning at 8:30 a.m. on Monday morning.  [facility] Address: [address] [place], [place] [address] Phone Number: [phone] Hours of Operation: If you need food, bring proper identification such as a driver's license to receive a bag of food once a month. Requirements: Can come once every 30 days with referral DSS, Holiday representative, Mental health etc. Each referral good for six visits. Photo ID required. *1st visit no referral required.  [facility] Address: [address] [place], [place] [address] Phone Number: [phone]  [place] [facility] Address: [address] [place], [place] [address] Phone Number: [phone] Hours of Operation:  You can register at [email or web address]/ for free groceries  [facility] Address: [address] [place], [place] [address] Phone Number: [phone] Hours of Operation: Drive through giveaway, first come first served. Every 3rd Saturday 11AM - 1PM  [facility] [address] Address: [address] [place] [address] Phone Number: [phone]   [place]  [facility] Address: [address] [place], [place] [address] Phone Number: [phone] Hours of Operation: Once a month every 3rd Saturday  [facility] Address: [address] [place], [place] [address] Phone Number:  [phone] Hours of Operation: Distribution happens from 9:00-10:00 a.m. every Saturday.     [facility] Address: [address] [place], [place] [address] Phone Number: [phone] Hours of Operation: ONCE a week for the community food distribution held every Tuesday, Wednesday and Thursday from 11 a.m. - 2:00 p.m. Food is available on a first come, first serve basis and varies week to week. No appointment necessary for drive thru pick up.  [facility] Address: [address] [place], [place] [address] Phone Number: [phone] Hours of Operation: Open every 3rd Thursday 9:30 a.m. - 11:00 a.m.  [facility] Address: [address] [place], [place] [address] Phone Number: [phone] Hours of Operation: Please call for hours, directions, and questions  [facility] [place] [facility] Address: [address], [place], [place]  [address] Phone Number: [phone] Website: https://www.Hollyguns.co.za Food Finder app: https://findfood.ghpfa.org  [facility] Address: [address] [place], [place] [address] Phone Number: [phone] Hours of Operation: Contact Bree Harpe. Enrolled Substance Abuse Clients Only  [facility] Address: [address] [place], [address]  Phone Number: [phone] Hours of Operation: Contact Clyde Lundborg. Food pantry open the 3rd Saturday of each month from 9 a.m. -12 p.m. only  [place] [facility] Address: [address] [place], [place] [address] Phone Number: [phone] Hours of Operation: Contact Juanda Crumble. Emergency food bank open on Saturdays by appointment only  [facility] Address: [address] [place], [place] [address] Phone Number: [phone] Hours of Operation: No specific contact person; Anyone can help  [facility] Address: [address] [place], [place] [address] Phone Number: [phone] Hours of Operation: Contact  Carney Bern. Agency gives out a bag of food every Thursday from 2-4 p.m. only, and also provides a community meal every Thursday between 5-6 p.m. Other services provided include rent/mortgage and utility assistance, women's winter shelter, thrift store, and senior adult activities.  [facility] [place] Address: [address] [place], [place] [address] Phone Number: [phone] Hours of Operation: The Emergency Food Assistance Program provides individuals and families with a generous supply of food including meat, fresh vegetables, and nonperishable items. The food box contains five days' worth of food, and each family or individual can receive a box once per month. M, W, Th, Fr 11am-2pm, walk-ins welcome.  [facility] Address: [address] [place], [place] [address]  Phone Number: [phone] Hours of Operation: Contact Asia Scarlette [place]. Tuesdays and Thursdays from 11am - 3pm by appointment only

## 2024-04-13 NOTE — Progress Notes (Signed)
 [facility] NEPHROLOGY PROGRESS NOTE  Assessment/ Plan: Pt is a 58 y.o. yo male  with past medical history significant for hypertension, no medical follow-up for more than 10 years seen for the evaluation and management of renal failure.   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  The patient had normal creatinine back in 2010 and then no follow-up labs until 04/2024. -Urine has around 0.9 g of protein, no RBC. CT scan ruled out hydronephrosis.  Hep B, hep C and HIV negative.  A1c 5.8.  ANA negative.  Protein electrophoresis, kappa lambda ratio still pending. -He is close to euvolemic now and denies any sign or symptoms of uremia.  I am switching diuretics to oral.  Hopefully his creatinine level will plan to and be able to follow-up outpatient.  It seems like he can avoid dialysis during this hospitalization.  I will follow morning lab.  I will start diet for him.  Communicated with the nurse and the primary team.   # HTN/LE edema/pulmonary edema/fluid overload: Presumably contributed by low GFR.  Treating with diuretics.  Strict ins and outs and close lab monitoring.  Clinically improving.   # Hypokalemia: Received potassium chloride repletion, follow lab.   # Anemia of CKD: Iron saturation 9% and serum iron level 23.  Starting IV Venofer and Aranesp.     # CKD-MBD/hypocalcemia: Pending PTH level.  Received calcium repletion.  Switching phosphorus binders to calcium based/calcium acetate.  Monitor lab.   Subjective: Seen and examined at the bedside.  Urine output is around 1.6 L.  Patient reported clinically feeling much better.  He denies nausea, vomiting, dysgeusia, chest pain, shortness of breath.   Objective Vital signs in last 24 hours: Vitals:   04/12/24 1647 04/12/24 2022 04/13/24 0454 04/13/24 0909  BP: (!) 108/98 135/88 121/69 (!) 138/91  Pulse: 84 84 77 74  Resp: 18 17 17 13   Temp: 97.8 F (36.6 C) 98.2  F (36.8 C) 98.6 F (37 C) 98.6 F (37 C)  TempSrc:  Oral Oral Oral  SpO2: 96% 94% 94% 97%  Weight:      Height:       Weight change:   Intake/Output Summary (Last 24 hours) at 04/13/2024 1049 Last data filed at 04/13/2024 1000 Gross per 24 hour  Intake 750 ml  Output 1250 ml  Net -500 ml       Labs: RENAL PANEL Recent Labs  Lab 04/10/24 2305 04/11/24 0058 04/11/24 0106 04/11/24 0350 04/12/24 0346 04/13/24 0348  NA 142  --  142 139 143 140  K 3.3*  --  3.3* 3.4* 4.2 3.5  CL 103  --  107 107 108 104  CO2 23  --   --  22 23 23   GLUCOSE 132*  --  104* 88 96 85  BUN 45*  --  47* 47* 49* 50*  CREATININE 5.73*  --  6.00* 5.63* 6.20* 6.37*  CALCIUM 5.0*  --   --  4.9* 5.2* 5.1*  MG  --  1.3*  --  1.3* 1.5* 1.5*  PHOS  --   --   --  5.5* 6.6*  --   ALBUMIN 2.7*  --   --   --   --   --     Liver Function Tests: Recent Labs  Lab 04/10/24 2305  AST 20  ALT 27  ALKPHOS 88  BILITOT 0.2  PROT 6.2*  ALBUMIN 2.7*  No results for input(s): LIPASE, AMYLASE in the last 168 hours. No results for input(s): AMMONIA in the last 168 hours. CBC: Recent Labs    04/10/24 2305 04/11/24 0106 04/11/24 0350 04/12/24 0346 04/13/24 0348  HGB 9.0* 10.5* 8.5* 8.5* 8.4*  MCV 84.7  --  84.4 84.2 83.4  VITAMINB12  --   --  372  --   --   FOLATE  --   --  7.0  --   --   FERRITIN  --   --  65  --   --   TIBC  --   --  272  --   --   IRON  --   --  23*  --   --   RETICCTPCT  --   --  1.0  --   --     Cardiac Enzymes: Recent Labs  Lab 04/11/24 0350  CKTOTAL 472*   CBG: No results for input(s): GLUCAP in the last 168 hours.  Iron Studies:  Recent Labs    04/11/24 0350  IRON 23*  TIBC 272  FERRITIN 65   Studies/Results: No results found.   Medications: Infusions:    Scheduled Medications:  darbepoetin (ARANESP) injection - DIALYSIS  60 mcg Subcutaneous Q Wed-1800   ferrous sulfate  325 mg Oral Q breakfast   furosemide  40 mg Oral BID   heparin   5,000 Units Subcutaneous Q8H   metoprolol succinate  50 mg Oral Daily   sevelamer carbonate  800 mg Oral TID WC   sodium chloride flush  3 mL Intravenous Q12H    have reviewed scheduled and prn medications.  Physical Exam: General:NAD, comfortable Heart:RRR, s1s2 nl Lungs:clear b/l, no crackle Abdomen:soft, Non-tender, non-distended Extremities: Bilateral leg edema improving chronic in nature Neurology: Alert awake and following commands Dialysis Access: Not yet  Judeen Geralds Prasad Zakayla Martinec 04/13/2024,10:49 AM  LOS: 2 days
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
## 2024-04-13 NOTE — Plan of Care (Signed)

## 2024-04-13 NOTE — Progress Notes (Signed)
 Progress Note   Patient: Kenneth Hart FMW:[id number] DOB: 1965/11/22 DOA: 04/10/2024     2 DOS: the patient was seen and examined on 04/13/2024   Brief hospital course: Dollie K Docter is a 58 y.o. 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   Patient has experienced some nausea, early satiety, and exertional dyspnea recently but had been able to go about his usual activities until developing atraumatic pain and swelling of the left foot.  He was evaluated for this at an employee health clinic yesterday, was given a shot of Toradol, and sent for radiographs and blood work.  He was told that blood work was concerning for a kidney problem and that prompt evaluation in the ED was recommended. Nephrology consulted, admitted to TRH service for further management.  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 A1c 5.8, no hydronephrosis on CT abdomen, Hep, HIV negative. Nephrology follow up appreciated. Continue Lasix 40mg  BID. If renal function did not improve.He may end up on HD. If better plan to discharge tomorrow with PCP, nephro follow up.  Hypocalcemia- He got multiple IV calcium gluconate doses. 2gm calcium gluconate ordered. Continue sevelamer for hyperphosphatemia.  Hypokalemia- repleted. Hypomagnesemia- oral supplements ordered.   New Onset HFmrEF- Pulmonary edema noted on chest CT in ED  Echocardiogram revealed EF 40%, focal wall motion abnormality. Cardiology evaluation appreciated. He is not candidate for heart cath due to renal dysfunction.  No evidence of overt fluid overload. Runs of NSVT noted, continue metoprolol 25 bid. Continue telemetry.   Anemia of chronic disease Iron deficiency  IV iron, aranesp per nephrology. Oral  iron supplements ordered.   Prolonged QT interval  QTc 508 ms in ED  Monitor electrolytes with correction, avoid QT-prolonging medications     Bacteriuria   UA wbc >50, large LE, no urinary symptoms, await urine cultures before starting antibiotics.  Obesity Class II- BMI 38.85 Weight reduction, low calorie diet ordered.     Out of bed to chair. Incentive spirometry. Nursing supportive care. Fall, aspiration precautions. Diet:  Diet Orders (From admission, onward)     Start     Ordered   04/13/24 0950  Diet renal with fluid restriction Fluid restriction: 1200 mL Fluid; Room service appropriate? Yes; Fluid consistency: Thin  Diet effective now       Question Answer Comment  Fluid restriction: 1200 mL Fluid   Room service appropriate? Yes   Fluid consistency: Thin      04/13/24 0949           DVT prophylaxis: heparin injection 5,000 Units Start: 04/11/24 0600  Level of care: Telemetry   Code Status: Full Code  Subjective: Patient is seen and examined today morning. He is more alert, awake, denies any complaints. Advised to work with PT/ OT.  Physical Exam: Vitals:   04/12/24 1647 04/12/24 2022 04/13/24 0454 04/13/24 0909  BP: (!) 108/98 135/88 121/69 (!) 138/91  Pulse: 84 84 77 74  Resp: 18 17 17 13   Temp: 97.8 F (36.6 C) 98.2 F (36.8 C) 98.6 F (37 C) 98.6 F (37 C)  TempSrc:  Oral Oral Oral  SpO2: 96% 94% 94% 97%  Weight:      Height:        General - Elderly obese African American male,  no apparent distress HEENT - PERRLA, EOMI, atraumatic head, non tender sinuses. Lung - distant breath sounds, basal rales, rhonchi, no wheezes. Heart - S1, S2 heard, no murmurs, rubs, 1+ pedal edema. Abdomen - Soft, non tender, obese, bowel sounds good Neuro - Alert, awake and oriented x 3, non focal exam. Skin - Warm and dry.  Data Reviewed:      Latest Ref Rng & Units 04/13/2024    3:48 AM 04/12/2024    3:46 AM 04/11/2024    3:50 AM  CBC  WBC 4.0 - 10.5  K/uL 9.0  10.0  11.0   Hemoglobin 13.0 - 17.0 g/dL 8.4  8.5  8.5   Hematocrit 39.0 - 52.0 % 28.7  29.3  29.3   Platelets 150 - 400 K/uL 291  317  309       Latest Ref Rng & Units 04/13/2024    3:48 AM 04/12/2024    3:46 AM 04/11/2024    3:50 AM  BMP  Glucose 70 - 99 mg/dL 85  96  88   BUN 6 - 20 mg/dL 50  49  47   Creatinine 0.61 - 1.24 mg/dL 3.62  3.79  4.36   Sodium 135 - 145 mmol/L 140  143  139   Potassium 3.5 - 5.1 mmol/L 3.5  4.2  3.4   Chloride 98 - 111 mmol/L 104  108  107   CO2 22 - 32 mmol/L 23  23  22    Calcium 8.9 - 10.3 mg/dL 5.1  5.2  4.9    No results found.   Family Communication: Discussed with patient, understand and agree. All questions answered.  Disposition: Status is: Inpatient Remains inpatient appropriate because: IV lasix therapy, follow kidney function, electrolytes, nephrology and cardiology.  Planned Discharge Destination: Home     Time spent: 44 minutes  Author: Concepcion Riser, MD 04/13/2024 3:40 PM Secure chat 7am to 7pm For on call review [email or web address].

## 2024-04-14 ENCOUNTER — Encounter (HOSPITAL_COMMUNITY): Payer: Self-pay | Admitting: Family Medicine

## 2024-04-14 ENCOUNTER — Inpatient Hospital Stay (HOSPITAL_COMMUNITY)

## 2024-04-14 DIAGNOSIS — E876 Hypokalemia: Secondary | ICD-10-CM | POA: Diagnosis not present

## 2024-04-14 DIAGNOSIS — I5021 Acute systolic (congestive) heart failure: Secondary | ICD-10-CM | POA: Diagnosis not present

## 2024-04-14 DIAGNOSIS — I472 Ventricular tachycardia, unspecified: Secondary | ICD-10-CM | POA: Diagnosis not present

## 2024-04-14 DIAGNOSIS — N185 Chronic kidney disease, stage 5: Secondary | ICD-10-CM | POA: Diagnosis not present

## 2024-04-14 DIAGNOSIS — I429 Cardiomyopathy, unspecified: Secondary | ICD-10-CM

## 2024-04-14 LAB — CBC
HCT: 28.3 % — ABNORMAL LOW (ref 39.0–52.0)
Hemoglobin: 8.4 g/dL — ABNORMAL LOW (ref 13.0–17.0)
MCH: 24.3 pg — ABNORMAL LOW (ref 26.0–34.0)
MCHC: 29.7 g/dL — ABNORMAL LOW (ref 30.0–36.0)
MCV: 82 fL (ref 80.0–100.0)
Platelets: 305 K/uL (ref 150–400)
RBC: 3.45 MIL/uL — ABNORMAL LOW (ref 4.22–5.81)
RDW: 18.9 % — ABNORMAL HIGH (ref 11.5–15.5)
WBC: 9.9 K/uL (ref 4.0–10.5)
nRBC: 0.2 % (ref 0.0–0.2)

## 2024-04-14 LAB — NM MYOCAR MULTI W/SPECT W/WALL MOTION / EF
Nuc Stress EF: 31 %
Peak HR: 93 {beats}/min
Rest HR: 78 {beats}/min
Rest Nuclear Isotope Dose: 11 mCi
ST Depression (mm): 0 mm
Stress Nuclear Isotope Dose: 30.4 mCi
TID: 1.02

## 2024-04-14 LAB — PROTEIN ELECTROPHORESIS, SERUM
A/G Ratio: 0.8 (ref 0.7–1.7)
Albumin ELP: 2.7 g/dL — ABNORMAL LOW (ref 2.9–4.4)
Alpha-1-Globulin: 0.3 g/dL (ref 0.0–0.4)
Alpha-2-Globulin: 0.7 g/dL (ref 0.4–1.0)
Beta Globulin: 1.1 g/dL (ref 0.7–1.3)
Gamma Globulin: 1.2 g/dL (ref 0.4–1.8)
Globulin, Total: 3.2 g/dL (ref 2.2–3.9)
Total Protein ELP: 5.9 g/dL — ABNORMAL LOW (ref 6.0–8.5)

## 2024-04-14 LAB — PTH, INTACT AND CALCIUM
Calcium, Total (PTH): 5.1 mg/dL — AB (ref 8.7–10.2)
PTH: 219 pg/mL — ABNORMAL HIGH (ref 15–65)

## 2024-04-14 LAB — BASIC METABOLIC PANEL WITH GFR
Anion gap: 14 (ref 5–15)
BUN: 52 mg/dL — ABNORMAL HIGH (ref 6–20)
CO2: 23 mmol/L (ref 22–32)
Calcium: 5.6 mg/dL — CL (ref 8.9–10.3)
Chloride: 105 mmol/L (ref 98–111)
Creatinine, Ser: 6.2 mg/dL — ABNORMAL HIGH (ref 0.61–1.24)
GFR, Estimated: 10 mL/min — ABNORMAL LOW (ref >60)
Glucose, Bld: 87 mg/dL (ref 70–99)
Potassium: 3.5 mmol/L (ref 3.5–5.1)
Sodium: 142 mmol/L (ref 135–145)

## 2024-04-14 LAB — PHOSPHORUS: Phosphorus: 6.5 mg/dL — ABNORMAL HIGH (ref 2.5–4.6)

## 2024-04-14 LAB — MAGNESIUM: Magnesium: 1.7 mg/dL (ref 1.7–2.4)

## 2024-04-14 MED ORDER — REGADENOSON 0.4 MG/5ML IV SOLN
INTRAVENOUS | Status: AC
  Filled 2024-04-14: qty 5

## 2024-04-14 MED ORDER — CALCIUM ACETATE (PHOS BINDER) 667 MG PO CAPS: 667.0000 mg | ORAL_CAPSULE | Freq: Three times a day (TID) | ORAL | Status: DC

## 2024-04-14 MED ORDER — FUROSEMIDE 10 MG/ML IJ SOLN
80.0000 mg | Freq: Every day | INTRAMUSCULAR | Status: DC
  Administered 2024-04-14 – 2024-04-15 (×2): 80 mg via INTRAVENOUS
  Filled 2024-04-14 (×2): qty 8

## 2024-04-14 MED ORDER — CALCIUM GLUCONATE-NACL 2-0.675 GM/100ML-% IV SOLN
2.0000 g | Freq: Once | INTRAVENOUS | Status: AC
  Administered 2024-04-14: 2000 mg via INTRAVENOUS
  Filled 2024-04-14: qty 100

## 2024-04-14 MED ORDER — CALCITRIOL 0.25 MCG PO CAPS
0.2500 ug | ORAL_CAPSULE | Freq: Every day | ORAL | Status: DC
  Administered 2024-04-14 – 2024-04-15 (×2): 0.25 ug via ORAL
  Filled 2024-04-14 (×2): qty 1

## 2024-04-14 MED ORDER — REGADENOSON 0.4 MG/5ML IV SOLN
0.4000 mg | Freq: Once | INTRAVENOUS | Status: AC
  Administered 2024-04-14: 0.4 mg via INTRAVENOUS
  Filled 2024-04-14: qty 5

## 2024-04-14 MED ORDER — TECHNETIUM TC 99M TETROFOSMIN IV KIT
30.0000 | PACK | Freq: Once | INTRAVENOUS | Status: AC
  Administered 2024-04-14: 30.4 via INTRAVENOUS

## 2024-04-14 MED ORDER — TECHNETIUM TC 99M TETROFOSMIN IV KIT
10.0000 | PACK | Freq: Once | INTRAVENOUS | Status: AC
  Administered 2024-04-14: 10 via INTRAVENOUS

## 2024-04-14 NOTE — Progress Notes (Signed)
 Rounding Note   Patient Name: Kenneth Hart Date of Encounter: 04/14/2024  [facility] Cardiologist: Annabella Scarce, MD (new)  Subjective Breathing improving with diuresis. No CP.  Scheduled Meds:  calcium acetate  667 mg Oral TID WC   darbepoetin (ARANESP) injection - DIALYSIS  60 mcg Subcutaneous Q Wed-1800   ferrous sulfate  325 mg Oral Q breakfast   furosemide  40 mg Oral BID   heparin  5,000 Units Subcutaneous Q8H   metoprolol succinate  50 mg Oral Daily   sodium chloride flush  3 mL Intravenous Q12H   Continuous Infusions:  PRN Meds: acetaminophen **OR** acetaminophen, fentaNYL (SUBLIMAZE) injection, oxyCODONE, senna, trimethobenzamide   Vital Signs  Vitals:   04/13/24 0909 04/13/24 1631 04/13/24 1959 04/14/24 0418  BP: (!) 138/91 119/79 (!) 137/103 133/74  Pulse: 74 72 77 75  Resp: 13 18 18 18   Temp: 98.6 F (37 C) 98.4 F (36.9 C) 97.8 F (36.6 C) 98.8 F (37.1 C)  TempSrc: Oral     SpO2: 97% 91% 94% 96%  Weight:      Height:        Intake/Output Summary (Last 24 hours) at 04/14/2024 0924 Last data filed at 04/14/2024 0417 Gross per 24 hour  Intake 240 ml  Output 300 ml  Net -60 ml      04/12/2024    5:00 AM  Last 3 Weights  Weight (lbs) 248 lb 0.3 oz  Weight (kg) 112.5 kg      Telemetry Sinus rhythm.  PVCs.  5 beats NSVT - Personally Reviewed  ECG  Sinus rhtyhm.  Rate 93 bpm.  Inferolateral TWI.  QTc 508 ms - Personally Reviewed  Physical Exam  VS:  BP 133/74 (BP Location: Left Arm)   Pulse 75   Temp 98.8 F (37.1 C)   Resp 18   Ht 5' 7 (1.702 m)   Wt 112.5 kg   SpO2 96%   BMI 38.85 kg/m  , BMI Body mass index is 38.85 kg/m. GENERAL:  Well appearing HEENT: Pupils equal round and reactive, fundi not visualized, oral mucosa unremarkable NECK:  No jugular venous distention, waveform within normal limits, carotid upstroke brisk and symmetric, no bruits, no thyromegaly LUNGS:  Clear to auscultation  bilaterally HEART:  RRR.  PMI not displaced or sustained,S1 and S2 within normal limits, no S3, no S4, no clicks, no rubs, no murmurs ABD:  Flat, positive bowel sounds normal in frequency in pitch, no bruits, no rebound, no guarding, no midline pulsatile mass, no hepatomegaly, no splenomegaly EXT:  2 plus pulses throughout, 1+ LE edema, no cyanosis no clubbing SKIN:  No rashes no nodules NEURO:  Cranial nerves II through XII grossly intact, motor grossly intact throughout PSYCH:  Cognitively intact, oriented to person place and time  Labs High Sensitivity Troponin:  No results for input(s): TROPONINIHS in the last 720 hours.   Chemistry Recent Labs  Lab 04/10/24 2305 04/11/24 0058 04/12/24 0346 04/13/24 0348 04/14/24 0426  NA 142   < > 143 140 142  K 3.3*   < > 4.2 3.5 3.5  CL 103   < > 108 104 105  CO2 23   < > 23 23 23   GLUCOSE 132*   < > 96 85 87  BUN 45*   < > 49* 50* 52*  CREATININE 5.73*   < > 6.20* 6.37* 6.20*  CALCIUM 5.0*   < > 5.2* 5.1* 5.6*  MG  --    < >  1.5* 1.5* 1.7  PROT 6.2*  --   --   --   --   ALBUMIN 2.7*  --   --   --   --   AST 20  --   --   --   --   ALT 27  --   --   --   --   ALKPHOS 88  --   --   --   --   BILITOT 0.2  --   --   --   --   GFRNONAA 11*   < > 10* 10* 10*  ANIONGAP 16*   < > 12 13 14    < > = values in this interval not displayed.    Lipids  Recent Labs  Lab 04/12/24 0346  CHOL 158  TRIG 43  HDL 48  LDLCALC 101*  CHOLHDL 3.3    Hematology Recent Labs  Lab 04/12/24 0346 04/13/24 0348 04/14/24 0426  WBC 10.0 9.0 9.9  RBC 3.48* 3.44* 3.45*  HGB 8.5* 8.4* 8.4*  HCT 29.3* 28.7* 28.3*  MCV 84.2 83.4 82.0  MCH 24.4* 24.4* 24.3*  MCHC 29.0* 29.3* 29.7*  RDW 19.1* 19.2* 18.9*  PLT 317 291 305   Thyroid  Recent Labs  Lab 04/11/24 1635  TSH 1.283    BNP Recent Labs  Lab 04/11/24 0058  BNP 1,608.5*    DDimer No results for input(s): DDIMER in the last 168 hours.   Radiology  No results found.   Cardiac  Studies Echo 04/11/24:  1. Left ventricular ejection fraction, by estimation, is 40 to 45%. Left  ventricular ejection fraction by 2D MOD biplane is 43.0 %. The left  ventricle has mildly decreased function. The left ventricle demonstrates  regional wall motion abnormalities (see   scoring diagram/findings for description). The left ventricular internal  cavity size was mildly dilated. There is mild concentric left ventricular  hypertrophy. Left ventricular diastolic parameters are consistent with  Grade III diastolic dysfunction  (restrictive). Elevated left atrial pressure. There is moderate  hypokinesis of the left ventricular, basal-mid inferoseptal wall, inferior  wall and inferolateral wall.   2. Right ventricular systolic function is mildly reduced. The right  ventricular size is moderately enlarged. Tricuspid regurgitation signal is  inadequate for assessing PA pressure.   3. Left atrial size was moderately dilated.   4. The mitral valve is normal in structure. Trivial mitral valve  regurgitation.   5. The aortic valve is tricuspid. Aortic valve regurgitation is not  visualized.   6. The inferior vena cava is dilated in size with <50% respiratory  variability, suggesting right atrial pressure of 15 mmHg.   Patient Profile   Mr. Chio is a 51M with untreated hypertension and tobacco abuse admitted with newly diagnosed HFrEF and CKD.   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  There is focal wall motion abnormality in the basal to mid inferoseptal, inferior and inferolateral myocardium. He is not currently a candidate for cath given his renal dysfunction.  He remains chest pian free.  He is having runs of NSVT which may be due to ischemia or electrolyte abnormalities. This persists despite increasing metoprolol succinate to 50 mg daily.  He had up to 33 beats previously.  5 beats NSVT in the last 24h.  Continue to supplement magnesium to maintain >2.  K>4.  Volume is being  removed per nephrology. TSH was wnl.  Recommend getting a Lexiscan Myoview today to ensure that we  aren't dealing with a large region of ischemia prior to discharging him.   Informed Consent   Shared Decision Making/Informed Consent The risks [chest pain, shortness of breath, cardiac arrhythmias, dizziness, blood pressure fluctuations, myocardial infarction, stroke/transient ischemic attack, nausea, vomiting, allergic reaction, radiation exposure, metallic taste sensation and life-threatening complications (estimated to be 1 in 10,000)], benefits (risk stratification, diagnosing coronary artery disease, treatment guidance) and alternatives of a nuclear stress test were discussed in detail with Mr. Holycross and he agrees to proceed.    # AoCKD:  Creatinine 6 on admission with multiple electrolyte abnormalities.  He is being evaluated by nephrology. UOP seems to have slowed.  Only 300 mL yesterday.  He is volume overloaded but breathing stable.  Will add daily weight.    # Tobacco: Advise cessation.    # HTN:  BP labile.  More elevated than usual this AM.  Has been 110s-130s.  Continue to monitor.  Add amlodipine or hydralazine if it remains elevated >130/80.      For questions or updates, please contact [facility] Please consult [email or web address] for contact info under       Signed, Annabella Scarce, MD  04/14/2024, 9:24 AM

## 2024-04-14 NOTE — Progress Notes (Signed)
 Progress Note   Patient: Kenneth Hart FMW:[id number] DOB: 1966-04-13 DOA: 04/10/2024     3 DOS: the patient was seen and examined on 04/14/2024   Brief hospital course: Boleslaw K Tawil is a 58 y.o. 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   Patient has experienced some nausea, early satiety, and exertional dyspnea recently but had been able to go about his usual activities until developing atraumatic pain and swelling of the left foot.  He was evaluated for this at an employee health clinic yesterday, was given a shot of Toradol, and sent for radiographs and blood work.  He was told that blood work was concerning for a kidney problem and that prompt evaluation in the ED was recommended. Nephrology consulted, admitted to TRH service for further management.  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 A1c 5.8, no hydronephrosis on CT abdomen, Hep, HIV negative. Nephrology follow up appreciated. Lasix changed to 80mg  IV BID. If renal function did not improve.He may end up on HD. If better plan to discharge tomorrow with PCP, nephro follow up.  Hypocalcemia- He got multiple IV calcium gluconate doses. 2gm calcium gluconate ordered. Continue phoslo for hyperphosphatemia.  Hypokalemia- repleted. Hypomagnesemia- oral supplements ordered.   New Onset HFmrEF- Pulmonary edema noted on chest CT in ED  Echocardiogram revealed EF 40%, focal wall motion abnormality. Cardiology evaluation appreciated. He is not candidate for heart cath due to renal dysfunction.  No evidence of overt fluid overload. Runs of NSVT noted, continue metoprolol 25 bid.  Continue telemetry. Cardiology planned for [facility] today.   Anemia of chronic disease Iron  deficiency  IV iron, aranesp per nephrology. Oral iron supplements ordered.   Prolonged QT interval  QTc 508 ms in ED  Monitor electrolytes with correction, avoid QT-prolonging medications     Bacteriuria   UA wbc >50, large LE, no urinary symptoms, await urine cultures before starting antibiotics.  Obesity Class II- BMI 38.85 Weight reduction, low calorie diet ordered.     Out of bed to chair. Incentive spirometry. Nursing supportive care. Fall, aspiration precautions. Diet:  Diet Orders (From admission, onward)     Start     Ordered   04/14/24 1307  Diet renal with fluid restriction Fluid restriction: 1200 mL Fluid; Room service appropriate? Yes; Fluid consistency: Thin  Diet effective now       Question Answer Comment  Fluid restriction: 1200 mL Fluid   Room service appropriate? Yes   Fluid consistency: Thin      04/14/24 1306           DVT prophylaxis: heparin injection 5,000 Units Start: 04/11/24 0600  Level of care: Telemetry   Code Status: Full Code  Subjective: Patient is seen and examined today morning after stress test. He  denies any complaints. Advised to work with PT/ OT.  Physical Exam: Vitals:   04/14/24 1213 04/14/24 1215 04/14/24 1305 04/14/24 1307  BP: (!) 158/76 (!) 142/83 (!) 158/105 (!) 161/95  Pulse:   82 81  Resp: 18 20 18 18   Temp:   97.9 F (36.6 C)   TempSrc:      SpO2:   96% 96%  Weight:      Height:        General - Elderly obese African American  male, no apparent distress HEENT - PERRLA, EOMI, atraumatic head, non tender sinuses. Lung - distant breath sounds, basal rales, rhonchi, no wheezes. Heart - S1, S2 heard, no murmurs, rubs, 1+ pedal edema. Abdomen - Soft, non tender, obese, bowel sounds good Neuro - Alert, awake and oriented x 3, non focal exam. Skin - Warm and dry.  Data Reviewed:      Latest Ref Rng & Units 04/14/2024    4:26 AM 04/13/2024    3:48 AM 04/12/2024    3:46 AM  CBC  WBC 4.0 - 10.5 K/uL 9.9   9.0  10.0   Hemoglobin 13.0 - 17.0 g/dL 8.4  8.4  8.5   Hematocrit 39.0 - 52.0 % 28.3  28.7  29.3   Platelets 150 - 400 K/uL 305  291  317       Latest Ref Rng & Units 04/14/2024    4:26 AM 04/13/2024    3:48 AM 04/12/2024    3:46 AM  BMP  Glucose 70 - 99 mg/dL 87  85  96   BUN 6 - 20 mg/dL 52  50  49   Creatinine 0.61 - 1.24 mg/dL 3.79  3.62  3.79   Sodium 135 - 145 mmol/L 142  140  143   Potassium 3.5 - 5.1 mmol/L 3.5  3.5  4.2   Chloride 98 - 111 mmol/L 105  104  108   CO2 22 - 32 mmol/L 23  23  23    Calcium 8.9 - 10.3 mg/dL 5.6  5.1  5.2    No results found.   Family Communication: Discussed with patient, understand and agree. All questions answered.  Disposition: Status is: Inpatient Remains inpatient appropriate because: IV lasix therapy, follow kidney function, electrolytes, nephrology and cardiology.  Planned Discharge Destination: Home     Time spent: 43 minutes  Author: Concepcion Riser, MD 04/14/2024 1:20 PM Secure chat 7am to 7pm For on call review [email or web address].

## 2024-04-14 NOTE — Progress Notes (Signed)
 Kenneth Hart NEPHROLOGY PROGRESS NOTE  Assessment/ Plan: Pt is a 58 y.o. yo male  with past medical history significant for hypertension, no medical follow-up for more than 10 years seen for the evaluation and management of renal failure.   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  The patient had normal creatinine back in 2010 and then no follow-up labs until 04/2024. -Urine has around 0.9 g of protein, no RBC. CT scan ruled out hydronephrosis.  Hep B, hep C and HIV negative.  A1c 5.8.  ANA negative.  Pending SPEP, k/l 2.25. -Reduced urine output with oral diuretics therefore switching back to IV Lasix 80 mg daily.  Noted he is going for my scan today.  Denies overt signs or symptoms of uremia.  We will monitor urine output and labs.  Patient understands that he may need dialysis if no improvement in kidney function.   # HTN/LE edema/pulmonary edema/fluid overload: Presumably contributed by low GFR.  Treating with diuretics.  Strict ins and outs and close lab monitoring.  Clinically improving.   # Hypokalemia: Received potassium chloride repletion, follow lab.   # Anemia of CKD: Iron saturation 9% and serum iron level 23.  Starting iron and Aranesp.     # CKD-MBD/hypocalcemia/secondary hyperparathyroidism: PTH 219 acceptable for CKD.  I will start calcitriol because of ongoing hypocalcemia.  Continue to replete calcium.  Monitor lab.  # Hyperphosphatemia: Start calcium acetate.  Follow lab.  Subjective: Seen and examined at the bedside.  Urine output is only 300 cc with oral Lasix.  No other new event.  Going for cardiac scan today.  He denies nausea, vomiting, dysgeusia, chest pain.  He does have dyspnea on exertion.  Objective Vital signs in last 24 hours: Vitals:   04/13/24 1631 04/13/24 1959 04/14/24 0418 04/14/24 0924  BP: 119/79 (!) 137/103 133/74 (!) 160/96  Pulse: 72 77 75 78  Resp: 18 18 18 18   Temp: 98.4 F  (36.9 C) 97.8 F (36.6 C) 98.8 F (37.1 C) 98 F (36.7 C)  TempSrc:      SpO2: 91% 94% 96% 96%  Weight:      Height:       Weight change:   Intake/Output Summary (Last 24 hours) at 04/14/2024 1100 Last data filed at 04/14/2024 0417 Gross per 24 hour  Intake --  Output 300 ml  Net -300 ml       Labs: RENAL PANEL Recent Labs  Lab 04/10/24 2305 04/11/24 0058 04/11/24 0106 04/11/24 0350 04/11/24 1635 04/12/24 0346 04/13/24 0348 04/14/24 0426  NA 142  --  142 139  --  143 140 142  K 3.3*  --  3.3* 3.4*  --  4.2 3.5 3.5  CL 103  --  107 107  --  108 104 105  CO2 23  --   --  22  --  23 23 23   GLUCOSE 132*  --  104* 88  --  96 85 87  BUN 45*  --  47* 47*  --  49* 50* 52*  CREATININE 5.73*  --  6.00* 5.63*  --  6.20* 6.37* 6.20*  CALCIUM 5.0*  --   --  4.9* 5.1 5.2* 5.1* 5.6*  MG  --  1.3*  --  1.3*  --  1.5* 1.5* 1.7  PHOS  --   --   --  5.5*  --  6.6*  --  6.5*  ALBUMIN 2.7*  --   --   --   --   --   --   --  Liver Function Tests: Recent Labs  Lab 04/10/24 2305  AST 20  ALT 27  ALKPHOS 88  BILITOT 0.2  PROT 6.2*  ALBUMIN 2.7*   No results for input(s): LIPASE, AMYLASE in the last 168 hours. No results for input(s): AMMONIA in the last 168 hours. CBC: Recent Labs    04/11/24 0106 04/11/24 0350 04/12/24 0346 04/13/24 0348 04/14/24 0426  HGB 10.5* 8.5* 8.5* 8.4* 8.4*  MCV  --  84.4 84.2 83.4 82.0  VITAMINB12  --  372  --   --   --   FOLATE  --  7.0  --   --   --   FERRITIN  --  65  --   --   --   TIBC  --  272  --   --   --   IRON  --  23*  --   --   --   RETICCTPCT  --  1.0  --   --   --     Cardiac Enzymes: Recent Labs  Lab 04/11/24 0350  CKTOTAL 472*   CBG: No results for input(s): GLUCAP in the last 168 hours.  Iron Studies:  No results for input(s): IRON, TIBC, TRANSFERRIN, FERRITIN in the last 72 hours.  Studies/Results: No results found.   Medications: Infusions:    Scheduled Medications:  calcium  acetate  667 mg Oral TID WC   darbepoetin (ARANESP) injection - DIALYSIS  60 mcg Subcutaneous Q Wed-1800   ferrous sulfate  325 mg Oral Q breakfast   furosemide  80 mg Intravenous Daily   heparin  5,000 Units Subcutaneous Q8H   metoprolol succinate  50 mg Oral Daily   sodium chloride flush  3 mL Intravenous Q12H   technetium tetrofosmin  10 millicurie Intravenous Once    have reviewed scheduled and prn medications.  Physical Exam: General:NAD, comfortable Heart:RRR, s1s2 nl Lungs:clear b/l, no crackle Abdomen:soft, Non-tender, non-distended Extremities: Bilateral leg edema improving chronic in nature Neurology: Alert awake and following commands   Kenneth Hart Kenneth Hart 04/14/2024,11:00 AM  LOS: 3 days

## 2024-04-14 NOTE — Progress Notes (Signed)
     Crosby K Boisselle presented for a Lexiscan nuclear stress test today.  I Waddell DELENA Donath, PA-C, provided direct supervision and was present during the stress portion of the study today, which was completed without significant symptoms, immediate complications, or acute ST/T changes on ECG.  Stress imaging is pending at this time.  Preliminary ECG findings may be listed in the chart, but the stress test result will not be finalized until perfusion imaging is complete.  Waddell DELENA Donath, PA-C  04/14/2024, 12:27 PM

## 2024-04-15 ENCOUNTER — Other Ambulatory Visit (HOSPITAL_COMMUNITY): Payer: Self-pay

## 2024-04-15 DIAGNOSIS — I5022 Chronic systolic (congestive) heart failure: Secondary | ICD-10-CM

## 2024-04-15 DIAGNOSIS — I5021 Acute systolic (congestive) heart failure: Secondary | ICD-10-CM | POA: Diagnosis not present

## 2024-04-15 DIAGNOSIS — R8271 Bacteriuria: Secondary | ICD-10-CM | POA: Diagnosis not present

## 2024-04-15 DIAGNOSIS — N185 Chronic kidney disease, stage 5: Secondary | ICD-10-CM | POA: Diagnosis not present

## 2024-04-15 DIAGNOSIS — E876 Hypokalemia: Secondary | ICD-10-CM | POA: Diagnosis not present

## 2024-04-15 LAB — BASIC METABOLIC PANEL WITH GFR
Anion gap: 14 (ref 5–15)
BUN: 53 mg/dL — ABNORMAL HIGH (ref 6–20)
CO2: 26 mmol/L (ref 22–32)
Calcium: 6 mg/dL — CL (ref 8.9–10.3)
Chloride: 102 mmol/L (ref 98–111)
Creatinine, Ser: 6.16 mg/dL — ABNORMAL HIGH (ref 0.61–1.24)
GFR, Estimated: 10 mL/min — ABNORMAL LOW (ref >60)
Glucose, Bld: 90 mg/dL (ref 70–99)
Potassium: 3.3 mmol/L — ABNORMAL LOW (ref 3.5–5.1)
Sodium: 142 mmol/L (ref 135–145)

## 2024-04-15 LAB — MAGNESIUM: Magnesium: 1.6 mg/dL — ABNORMAL LOW (ref 1.7–2.4)

## 2024-04-15 MED ORDER — CALCIUM ACETATE (PHOS BINDER) 667 MG PO CAPS
667.0000 mg | ORAL_CAPSULE | Freq: Three times a day (TID) | ORAL | 1 refills | Status: AC
  Filled 2024-04-15: qty 90, 30d supply, fill #0

## 2024-04-15 MED ORDER — FUROSEMIDE 40 MG PO TABS: 40.0000 mg | ORAL_TABLET | Freq: Two times a day (BID) | ORAL | Status: DC

## 2024-04-15 MED ORDER — CALCIUM GLUCONATE-NACL 1-0.675 GM/50ML-% IV SOLN
1.0000 g | Freq: Once | INTRAVENOUS | Status: AC
  Administered 2024-04-15: 1000 mg via INTRAVENOUS
  Filled 2024-04-15: qty 50

## 2024-04-15 MED ORDER — CALCITRIOL 0.25 MCG PO CAPS
0.2500 ug | ORAL_CAPSULE | Freq: Every day | ORAL | 1 refills | Status: AC
  Filled 2024-04-15: qty 30, 30d supply, fill #0

## 2024-04-15 MED ORDER — AMLODIPINE BESYLATE 2.5 MG PO TABS
2.5000 mg | ORAL_TABLET | Freq: Every day | ORAL | 2 refills | Status: AC
  Filled 2024-04-15: qty 30, 30d supply, fill #0

## 2024-04-15 MED ORDER — POTASSIUM CHLORIDE CRYS ER 20 MEQ PO TBCR
40.0000 meq | EXTENDED_RELEASE_TABLET | Freq: Once | ORAL | Status: AC
  Administered 2024-04-15: 40 meq via ORAL
  Filled 2024-04-15: qty 2

## 2024-04-15 MED ORDER — FUROSEMIDE 40 MG PO TABS
40.0000 mg | ORAL_TABLET | Freq: Two times a day (BID) | ORAL | 2 refills | Status: AC
  Filled 2024-04-15: qty 60, 30d supply, fill #0

## 2024-04-15 MED ORDER — ASPIRIN 81 MG PO TBEC
81.0000 mg | DELAYED_RELEASE_TABLET | Freq: Every day | ORAL | 2 refills | Status: AC
  Filled 2024-04-15: qty 30, 30d supply, fill #0

## 2024-04-15 MED ORDER — FERROUS SULFATE 325 (65 FE) MG PO TABS
325.0000 mg | ORAL_TABLET | Freq: Every day | ORAL | 1 refills | Status: AC
  Filled 2024-04-15: qty 90, 90d supply, fill #0

## 2024-04-15 MED ORDER — METOPROLOL SUCCINATE ER 50 MG PO TB24
50.0000 mg | ORAL_TABLET | Freq: Every day | ORAL | 1 refills | Status: AC
Start: 2024-04-16 — End: ?
  Filled 2024-04-15: qty 30, 30d supply, fill #0

## 2024-04-15 MED ORDER — AMLODIPINE BESYLATE 5 MG PO TABS
2.5000 mg | ORAL_TABLET | Freq: Every day | ORAL | Status: DC
  Administered 2024-04-15: 2.5 mg via ORAL
  Filled 2024-04-15: qty 1

## 2024-04-15 NOTE — Discharge Summary (Signed)
 Physician Discharge Summary   Patient: Kenneth Hart MRN: [id number] DOB: Mar 11, 1966  Admit date:     04/10/2024  Discharge date: {dischdate:26783}  Discharge Physician: Concepcion Riser   PCP: Pcp, No   Recommendations at discharge:  {Tip this will not be part of the note when signed- Example include specific recommendations for outpatient follow-up, pending tests to follow-up on. (Optional):26781} PCP follow up in 1 week. Cardiology follow up as scheduled. Nephrology close follow up as instructed with repeat labs.  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 *  Hospital Course: Kenneth Hart is a 58 y.o. 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   Patient has experienced some nausea, early satiety, and exertional dyspnea recently but had been able to go about his usual activities until developing atraumatic pain and swelling of the left foot.  He was evaluated for this at an [facility] clinic yesterday, was given a shot of Toradol, and sent for radiographs and blood work.  He was told that blood work was concerning for a kidney problem and that prompt evaluation in the ED was recommended. Nephrology consulted, admitted to [facility] service for further management.   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 A1c 5.8, no hydronephrosis on CT abdomen, Hep, HIV  negative. Nephrology follow up appreciated. He got IV Lasix 40-80mg  BID in the hospital. Renal function urine output remained stable, nephrology advised outpatient close follow up. He may end up on HD in future. Patient understands and agrees. Scripts sent to [facility] pharmacy. PCP, nephrology and cardiology follow up suggested.   New Onset HFmrEF- Pulmonary edema noted on chest CT, Echocardiogram revealed EF 40%, focal wall motion abnormality. Cardiology evaluation appreciated. He is not candidate for heart cath due to renal dysfunction. Runs of NSVT noted, continued metoprolol 25 bid. Lexiscan done showed evidence of infarction. Cardiology advised medical management, toprol xl 50 daily, norvasc 2.5 bid, lasix regimen per nephro and outpatient Cardiology follow up.  Hypocalcemia- He got multiple IV calcium gluconate doses. Continue rocaltriol, phoslo per nephrology.   Hypokalemia- repleted. Hypomagnesemia- improved.   Anemia of chronic disease Iron deficiency  IV iron, aranesp per nephrology. Oral iron supplements ordered.   Prolonged QT interval  Avoid QT-prolonging medications     Bacteriuria   UA wbc >50, large LE, no urinary symptoms, urine cultures no growth.   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   {Tip this will not be part of the note when signed Body mass index is 37.08 kg/m. , ,  (Optional):26781}  {(NOTE) Pain control PDMP Statment (Optional):26782} Consultants: nephrology, cardiology Procedures performed: stress test Disposition: Home Diet recommendation:  Discharge Diet Orders (From admission, onward)     Start     Ordered   04/15/24 0000  Diet renal with fluid restriction        04/15/24 1427  Renal diet DISCHARGE MEDICATION: Allergies as of 04/15/2024       Reactions   Amoxicillin Hives        Medication List     TAKE these medications    amLODipine 2.5 MG tablet Commonly known as: NORVASC Take 1  tablet (2.5 mg total) by mouth daily. Start taking on: April 16, 2024   aspirin EC 81 MG tablet Take 1 tablet (81 mg total) by mouth daily. Swallow whole.   calcitRIOL 0.25 MCG capsule Commonly known as: ROCALTROL Take 1 capsule (0.25 mcg total) by mouth daily. Start taking on: April 16, 2024   calcium acetate 667 MG capsule Commonly known as: PHOSLO Take 1 capsule (667 mg total) by mouth 3 (three) times daily with meals.   ferrous sulfate 325 (65 FE) MG tablet Take 1 tablet (325 mg total) by mouth daily with breakfast. Start taking on: April 16, 2024   furosemide 40 MG tablet Commonly known as: LASIX Take 1 tablet (40 mg total) by mouth 2 (two) times daily.   metoprolol succinate 50 MG 24 hr tablet Commonly known as: TOPROL-XL Take 1 tablet (50 mg total) by mouth daily. Take with or immediately following a meal. Start taking on: April 16, 2024        Discharge Exam: Kenneth Hart   04/12/24 0500 04/15/24 0442  Weight: 112.5 kg 107.4 kg      04/15/2024    9:05 AM 04/15/2024    4:47 AM 04/15/2024    4:42 AM  Vitals with BMI  Weight   236 lbs 12 oz  BMI   37.08  Systolic 153 146   Diastolic 90 80   Pulse 82 80    General - Elderly obese African American male, no apparent distress HEENT - PERRLA, EOMI, atraumatic head, non tender sinuses. Lung - distant breath sounds, basal rales, rhonchi, no wheezes. Heart - S1, S2 heard, no murmurs, rubs, 1+ pedal edema. Abdomen - Soft, non tender, obese, bowel sounds good Neuro - Alert, awake and oriented x 3, non focal exam. Skin - Warm and dry.  Condition at discharge: stable  The results of significant diagnostics from this hospitalization (including imaging, microbiology, ancillary and laboratory) are listed below for reference.   Imaging Studies: NM Myocar Multi W/Spect W/Wall Motion / EF Result Date: 04/14/2024   Findings are consistent with inferior infarction. The study is intermediate risk.   No ST  deviation was noted.   LV perfusion is abnormal. There is no evidence of ischemia. There is evidence of infarction. Defect 1: There is a medium defect with moderate reduction in uptake present in the apical to basal inferior and apex location(s) that is fixed. There is abnormal wall motion in the defect area. Consistent with infarction.   Left ventricular function is abnormal. Global function is moderately reduced. Nuclear stress EF: 31%. The left ventricular ejection fraction is moderately decreased (30-44%). End diastolic cavity size is severely enlarged. End systolic cavity size is severely enlarged. No evidence of transient ischemic dilation (TID) noted.   Prior study not available for comparison.   ECHOCARDIOGRAM COMPLETE Result Date: 04/11/2024    ECHOCARDIOGRAM REPORT   Patient Name:   Kenneth Hart Date of Exam: 04/11/2024 Medical Rec #:  [id number]        Height:       67.0 in Accession #:    [id number]       Weight:       265.0 lb Date of Birth:  03/27/1966  BSA:          2.279 m Patient Age:    58 years         BP:           146/88 mmHg Patient Gender: M                HR:           88 bpm. Exam Location:  Inpatient Procedure: 2D Echo, Cardiac Doppler and Color Doppler (Both Spectral and Color            Flow Doppler were utilized during procedure). Indications:    I50.31 Acute diastolic (congestive) heart failure  History:        Patient has no prior history of Echocardiogram examinations.                 Risk Factors:Hypertension.  Sonographer:    Damien Senior RDCS Referring Phys: [id number] TIMOTHY S OPYD IMPRESSIONS  1. Left ventricular ejection fraction, by estimation, is 40 to 45%. Left ventricular ejection fraction by 2D MOD biplane is 43.0 %. The left ventricle has mildly decreased function. The left ventricle demonstrates regional wall motion abnormalities (see  scoring diagram/findings for description). The left ventricular internal cavity size was mildly dilated. There is mild  concentric left ventricular hypertrophy. Left ventricular diastolic parameters are consistent with Grade III diastolic dysfunction (restrictive). Elevated left atrial pressure. There is moderate hypokinesis of the left ventricular, basal-mid inferoseptal wall, inferior wall and inferolateral wall.  2. Right ventricular systolic function is mildly reduced. The right ventricular size is moderately enlarged. Tricuspid regurgitation signal is inadequate for assessing PA pressure.  3. Left atrial size was moderately dilated.  4. The mitral valve is normal in structure. Trivial mitral valve regurgitation.  5. The aortic valve is tricuspid. Aortic valve regurgitation is not visualized.  6. The inferior vena cava is dilated in size with <50% respiratory variability, suggesting right atrial pressure of 15 mmHg. FINDINGS  Left Ventricle: Left ventricular ejection fraction, by estimation, is 40 to 45%. Left ventricular ejection fraction by 2D MOD biplane is 43.0 %. The left ventricle has mildly decreased function. The left ventricle demonstrates regional wall motion abnormalities. Moderate hypokinesis of the left ventricular, basal-mid inferoseptal wall, inferior wall and inferolateral wall. The left ventricular internal cavity size was mildly dilated. There is mild concentric left ventricular hypertrophy. Left ventricular diastolic parameters are consistent with Grade III diastolic dysfunction (restrictive). Elevated left atrial pressure. Right Ventricle: The right ventricular size is moderately enlarged. No increase in right ventricular wall thickness. Right ventricular systolic function is mildly reduced. Tricuspid regurgitation signal is inadequate for assessing PA pressure. Left Atrium: Left atrial size was moderately dilated. Right Atrium: Right atrial size was normal in size. Pericardium: There is no evidence of pericardial effusion. Mitral Valve: The mitral valve is normal in structure. Trivial mitral valve  regurgitation. Tricuspid Valve: The tricuspid valve is normal in structure. Tricuspid valve regurgitation is trivial. Aortic Valve: The aortic valve is tricuspid. Aortic valve regurgitation is not visualized. Pulmonic Valve: The pulmonic valve was grossly normal. Pulmonic valve regurgitation is not visualized. Aorta: The aortic root and ascending aorta are structurally normal, with no evidence of dilitation. Venous: The inferior vena cava is dilated in size with less than 50% respiratory variability, suggesting right atrial pressure of 15 mmHg. IAS/Shunts: No atrial level shunt detected by color flow Doppler.  LEFT VENTRICLE PLAX 2D  Biplane EF (MOD) LVIDd:         5.80 cm         LV Biplane EF:   Left LVIDs:         4.20 cm                          ventricular LV PW:         1.20 cm                          ejection LV IVS:        1.40 cm                          fraction by LVOT diam:     2.20 cm                          2D MOD LV SV:         81                               biplane is LV SV Index:   36                               43.0 %. LVOT Area:     3.80 cm LV IVRT:       87 msec         Diastology                                LV e' medial:    4.90 cm/s                                LV E/e' medial:  25.9 LV Volumes (MOD)               LV e' lateral:   4.24 cm/s LV vol d, MOD    149.0 ml      LV E/e' lateral: 30.0 A2C: LV vol d, MOD    170.0 ml A4C: LV vol s, MOD    87.6 ml A2C: LV vol s, MOD    96.0 ml A4C: LV SV MOD A2C:   61.4 ml LV SV MOD A4C:   170.0 ml LV SV MOD BP:    71.0 ml RIGHT VENTRICLE RV S prime:     10.90 cm/s TAPSE (M-mode): 1.8 cm LEFT ATRIUM              Index        RIGHT ATRIUM           Index LA diam:        6.00 cm  2.63 cm/m   RA Area:     23.30 cm LA Vol (A2C):   84.4 ml  37.03 ml/m  RA Volume:   76.90 ml  33.74 ml/m LA Vol (A4C):   109.0 ml 47.83 ml/m LA Biplane Vol: 97.1 ml  42.61 ml/m  AORTIC VALVE LVOT Vmax:   107.00 cm/s LVOT Vmean:  79.600 cm/s  LVOT VTI:    0.214 m  AORTA Ao Root diam: 3.30 cm Ao Asc  diam:  3.20 cm MITRAL VALVE MV Area (PHT): 3.85 cm     SHUNTS MV Decel Time: 197 msec     Systemic VTI:  0.21 m MV E velocity: 127.00 cm/s  Systemic Diam: 2.20 cm MV A velocity: 48.40 cm/s MV E/A ratio:  2.62 Mihai Croitoru MD Electronically signed by Jerel Balding MD Signature Date/Time: 04/11/2024/9:18:51 AM    Final    CT Chest Wo Contrast Result Date: 04/11/2024 EXAM: CT CHEST WITHOUT CONTRAST 04/11/2024 01:50:27 AM TECHNIQUE: CT of the chest was performed without the administration of intravenous contrast. Multiplanar reformatted images are provided for review. Automated exposure control, iterative reconstruction, and/or weight based adjustment of the mA/kV was utilized to reduce the radiation dose to as low as reasonably achievable. COMPARISON: None available. CLINICAL HISTORY: Respiratory illness, nondiagnostic xray. FINDINGS: MEDIASTINUM: Cardiomegaly. Low-density blood pool compatible with anemia. Pericardium is unremarkable. The central airways are clear. LYMPH NODES: Shotty mediastinal lymph nodes likely reactive. Evaluation of hilar lymph nodes is limited without IV contrast. No axillary lymphadenopathy. LUNGS AND PLEURA: Diffuse interlobular septal thickening and bronchial wall thickening. Peribronchovascular patchy ground glass opacities. Small right and trace left pleural effusions. No pneumothorax. SOFT TISSUES/BONES: No acute abnormality of the bones or soft tissues. UPPER ABDOMEN: Limited images of the upper abdomen demonstrates no acute abnormality. IMPRESSION: 1. CHF with interstitial and alveolar edema. Small right and trace left pleural effusion. Electronically signed by: [name] MD 04/11/2024 02:00 AM EST RP Workstation: [id number]   CT Renal Stone Study Result Date: 04/11/2024 EXAM: CT UROGRAM 04/11/2024 01:50:27 AM TECHNIQUE: CT of the abdomen and pelvis was performed without the administration of intravenous contrast  as per CT urogram protocol. Multiplanar reformatted images as well as MIP urogram images are provided for review. Automated exposure control, iterative reconstruction, and/or weight based adjustment of the mA/kV was utilized to reduce the radiation dose to as low as reasonably achievable. COMPARISON: None available. CLINICAL HISTORY: Abdominal/flank pain, stone suspected. FINDINGS: LOWER CHEST: Small right pleural effusion. Interlobular septal thickening and patchy ground-glass opacities in the lower lung suspicious for edema. Cardiomegaly. LIVER: The liver is unremarkable. GALLBLADDER AND BILE DUCTS: Gallbladder is unremarkable. No biliary ductal dilatation. SPLEEN: No acute abnormality. PANCREAS: No acute abnormality. ADRENAL GLANDS: No acute abnormality. KIDNEYS, URETERS AND BLADDER: No stones in the kidneys or ureters. No hydronephrosis. Nonspecific symmetric perinephric stranding. Urinary bladder is unremarkable. GI AND BOWEL: Stomach demonstrates no acute abnormality. There is no bowel obstruction. Normal appendix. PERITONEUM AND RETROPERITONEUM: No ascites. No free air. Mesenteric edema. VASCULATURE: Aorta is normal in caliber. LYMPH NODES: No lymphadenopathy. REPRODUCTIVE ORGANS: No acute abnormality. BONES AND SOFT TISSUES: No acute osseous abnormality. Body wall anasarca. IMPRESSION: 1. Findings suggest congestive heart failure with 3rd spacing of fluid including pulmonary, mesenteric, and body wall edema and small right pleural effusion. Electronically signed by: [name] MD 04/11/2024 01:57 AM EST RP Workstation: [id number]    Microbiology: Results for orders placed or performed during the hospital encounter of 04/10/24  Urine Culture     Status: None   Collection Time: 04/11/24  1:24 AM   Specimen: Urine, Clean Catch  Result Value Ref Range Status   Specimen Description URINE, CLEAN CATCH  Final   Special Requests NONE  Final   Culture   Final    NO GROWTH Performed at [facility], [address] [place], [place] [address]    Report Status 04/12/2024 FINAL  Final    Labs: CBC: Recent Labs  Lab  04/10/24 2305 04/11/24 0106 04/11/24 0350 04/12/24 0346 04/13/24 0348 04/14/24 0426  WBC 13.6*  --  11.0* 10.0 9.0 9.9  HGB 9.0* 10.5* 8.5* 8.5* 8.4* 8.4*  HCT 30.9* 31.0* 29.3* 29.3* 28.7* 28.3*  MCV 84.7  --  84.4 84.2 83.4 82.0  PLT 336  --  309 317 291 305   Basic Metabolic Panel: Recent Labs  Lab 04/11/24 0350 04/11/24 1635 04/12/24 0346 04/13/24 0348 04/14/24 0426 04/15/24 0458  NA 139  --  143 140 142 142  K 3.4*  --  4.2 3.5 3.5 3.3*  CL 107  --  108 104 105 102  CO2 22  --  23 23 23 26   GLUCOSE 88  --  96 85 87 90  BUN 47*  --  49* 50* 52* 53*  CREATININE 5.63*  --  6.20* 6.37* 6.20* 6.16*  CALCIUM 4.9* 5.1 5.2* 5.1* 5.6* 6.0*  MG 1.3*  --  1.5* 1.5* 1.7 1.6*  PHOS 5.5*  --  6.6*  --  6.5*  --    Liver Function Tests: Recent Labs  Lab 04/10/24 2305  AST 20  ALT 27  ALKPHOS 88  BILITOT 0.2  PROT 6.2*  ALBUMIN 2.7*   CBG: No results for input(s): GLUCAP in the last 168 hours.  Discharge time spent: 37 minutes.  Signed: Concepcion Riser, MD [facility] 04/15/2024

## 2024-04-15 NOTE — Progress Notes (Signed)
 Progress Note  Patient Name: Kenneth Hart Date of Encounter: 04/15/2024  Primary Cardiologist:   Annabella Scarce, MD   Subjective   He denies any chest pain or SOB.   Inpatient Medications    Scheduled Meds:  calcitRIOL  0.25 mcg Oral Daily   calcium acetate  667 mg Oral TID WC   darbepoetin (ARANESP) injection - DIALYSIS  60 mcg Subcutaneous Q Wed-1800   ferrous sulfate  325 mg Oral Q breakfast   furosemide  40 mg Oral BID   heparin  5,000 Units Subcutaneous Q8H   metoprolol succinate  50 mg Oral Daily   sodium chloride flush  3 mL Intravenous Q12H   Continuous Infusions:  PRN Meds: acetaminophen **OR** acetaminophen, fentaNYL (SUBLIMAZE) injection, oxyCODONE, senna, trimethobenzamide   Vital Signs    Vitals:   04/14/24 1942 04/15/24 0442 04/15/24 0447 04/15/24 0905  BP: (!) 143/92  (!) 146/80 (!) 153/90  Pulse: 77  80 82  Resp: 18  18 18   Temp: 98.4 F (36.9 C)  98.3 F (36.8 C) 98.2 F (36.8 C)  TempSrc:   Oral   SpO2: 91%  91% 96%  Weight:  107.4 kg    Height:        Intake/Output Summary (Last 24 hours) at 04/15/2024 1249 Last data filed at 04/15/2024 1124 Gross per 24 hour  Intake 483 ml  Output 950 ml  Net -467 ml   Filed Weights   04/12/24 0500 04/15/24 0442  Weight: 112.5 kg 107.4 kg    Telemetry    NSR, rare ectopy - Personally Reviewed  ECG    NA - Personally Reviewed  Physical Exam   GEN: No acute distress.   Neck: No  JVD Cardiac: RRR, no murmurs, rubs, or gallops.  Respiratory: Clear  to auscultation bilaterally. GI: Soft, nontender, non-distended  MS: No  edema; No deformity. Neuro:  Nonfocal  Psych: Normal affect   Labs    Chemistry Recent Labs  Lab 04/10/24 2305 04/11/24 0106 04/13/24 0348 04/14/24 0426 04/15/24 0458  NA 142   < > 140 142 142  K 3.3*   < > 3.5 3.5 3.3*  CL 103   < > 104 105 102  CO2 23   < > 23 23 26   GLUCOSE 132*   < > 85 87 90  BUN 45*   < > 50* 52* 53*  CREATININE 5.73*   < >  6.37* 6.20* 6.16*  CALCIUM 5.0*   < > 5.1* 5.6* 6.0*  PROT 6.2*  --   --   --   --   ALBUMIN 2.7*  --   --   --   --   AST 20  --   --   --   --   ALT 27  --   --   --   --   ALKPHOS 88  --   --   --   --   BILITOT 0.2  --   --   --   --   GFRNONAA 11*   < > 10* 10* 10*  ANIONGAP 16*   < > 13 14 14    < > = values in this interval not displayed.     Hematology Recent Labs  Lab 04/12/24 0346 04/13/24 0348 04/14/24 0426  WBC 10.0 9.0 9.9  RBC 3.48* 3.44* 3.45*  HGB 8.5* 8.4* 8.4*  HCT 29.3* 28.7* 28.3*  MCV 84.2 83.4 82.0  MCH 24.4* 24.4* 24.3*  MCHC 29.0* 29.3* 29.7*  RDW 19.1* 19.2* 18.9*  PLT 317 291 305    Cardiac EnzymesNo results for input(s): TROPONINI in the last 168 hours. No results for input(s): TROPIPOC in the last 168 hours.   BNP Recent Labs  Lab 04/11/24 0058  BNP 1,608.5*     DDimer No results for input(s): DDIMER in the last 168 hours.   Radiology    NM Myocar Multi W/Spect W/Wall Motion / EF Result Date: 04/14/2024   Findings are consistent with inferior infarction. The study is intermediate risk.   No ST deviation was noted.   LV perfusion is abnormal. There is no evidence of ischemia. There is evidence of infarction. Defect 1: There is a medium defect with moderate reduction in uptake present in the apical to basal inferior and apex location(s) that is fixed. There is abnormal wall motion in the defect area. Consistent with infarction.   Left ventricular function is abnormal. Global function is moderately reduced. Nuclear stress EF: 31%. The left ventricular ejection fraction is moderately decreased (30-44%). End diastolic cavity size is severely enlarged. End systolic cavity size is severely enlarged. No evidence of transient ischemic dilation (TID) noted.   Prior study not available for comparison.    Cardiac Studies   See above.   Echo 11/11  1. Left ventricular ejection fraction, by estimation, is 40 to 45%. Left  ventricular ejection  fraction by 2D MOD biplane is 43.0 %. The left  ventricle has mildly decreased function. The left ventricle demonstrates  regional wall motion abnormalities (see   scoring diagram/findings for description). The left ventricular internal  cavity size was mildly dilated. There is mild concentric left ventricular  hypertrophy. Left ventricular diastolic parameters are consistent with  Grade III diastolic dysfunction  (restrictive). Elevated left atrial pressure. There is moderate  hypokinesis of the left ventricular, basal-mid inferoseptal wall, inferior  wall and inferolateral wall.   2. Right ventricular systolic function is mildly reduced. The right  ventricular size is moderately enlarged. Tricuspid regurgitation signal is  inadequate for assessing PA pressure.   3. Left atrial size was moderately dilated.   4. The mitral valve is normal in structure. Trivial mitral valve  regurgitation.   5. The aortic valve is tricuspid. Aortic valve regurgitation is not  visualized.   6. The inferior vena cava is dilated in size with <50% respiratory  variability, suggesting right atrial pressure of 15 mmHg.   Patient Profile     58 y.o. male 46M with untreated hypertension and tobacco abuse admitted with newly diagnosed HFrEF and CKD.   Assessment & Plan   AoCKD: Creatinine remains elevated.  Minimal urine output.  Plan is for medical management nephrology as written for p.o. diuretic dosing at home and close follow-up.  Avoiding all nephrotoxins.  Tobacco: Advise cessation.    HTN: Blood pressure remains elevated.   I will add amlodipine 2.5 mg daily.  He needs to keep a BP diary at home.   HFmrEF:   EF is 40 to 45%.  Being managed medically for now with no invasive evaluation planned.  There is on the perfusion study completed a medium sized defect with moderate reduction in the apical to basal inferior and apical location that are fixed.  This is consistent with infarction.  Will continue  with medical management.  Discussed with the patient.     We will arrange hospital follow up.    For questions or updates, please contact [facility] Please consult [email or web address]  for contact info under Cardiology/STEMI.   Signed, Lynwood Schilling, MD  04/15/2024, 12:49 PM

## 2024-04-15 NOTE — Plan of Care (Signed)
   Problem: Activity: Goal: Risk for activity intolerance will decrease Outcome: Progressing

## 2024-04-15 NOTE — Progress Notes (Addendum)
 [facility] NEPHROLOGY PROGRESS NOTE  Assessment/ Plan: Pt is a 58 y.o. yo male  with past medical history significant for hypertension, no medical follow-up for more than 10 years seen for the evaluation and management of renal failure.   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  The patient had normal creatinine back in 2010 and then no follow-up labs until 04/2024. -Urine has around 0.9 g of protein, no RBC. CT scan ruled out hydronephrosis.  Hep B, hep C and HIV negative.  A1c 5.8.  ANA negative.  SPEP no M spike,  k/l 2.25. -He is euvolemic with diuretics and creatinine level remained stable around 6.1-6.3.  No overt sign or symptoms of uremia therefore no urgent need for dialysis. -Switching diuretics to p.o. Lasix 40 mg twice a day.  He can be discharged home with close OP follow-up.  I will arrange outpatient follow-up at [facility].  I also provided [facility] contact information to the patient.   # HTN/LE edema/pulmonary edema/fluid overload: Presumably contributed by low GFR.  Treating with diuretics.  Strict ins and outs and close lab monitoring.  Clinically improved.   # Hypokalemia: Received potassium chloride repletion, follow lab.   # Anemia of CKD: Iron saturation 9% and serum iron level 23.  Starting iron and Aranesp.     # CKD-MBD/hypocalcemia/secondary hyperparathyroidism: PTH 219 acceptable for CKD.  Started calcitriol because of ongoing hypocalcemia.  Continue to replete calcium.  Monitor lab.  # Hyperphosphatemia: Started calcium acetate.  Follow lab.  Clinically stable.  Ok to discharge.  Discussed with the primary team. Sign off, please call us  back with question.  Subjective: Seen and examined at the bedside.  Urine output is recorded only 300 cc however patient said he has been passing urine good.  He denies nausea, vomiting, dysgeusia, chest pain, shortness of breath.  Able to  walk.  Objective Vital signs in last 24 hours: Vitals:   04/14/24 1942 04/15/24 0442 04/15/24 0447 04/15/24 0905  BP: (!) 143/92  (!) 146/80 (!) 153/90  Pulse: 77  80 82  Resp: 18  18 18   Temp: 98.4 F (36.9 C)  98.3 F (36.8 C) 98.2 F (36.8 C)  TempSrc:   Oral   SpO2: 91%  91% 96%  Weight:  107.4 kg    Height:       Weight change:   Intake/Output Summary (Last 24 hours) at 04/15/2024 1048 Last data filed at 04/15/2024 0827 Gross per 24 hour  Intake 3 ml  Output 400 ml  Net -397 ml       Labs: RENAL PANEL Recent Labs  Lab 04/10/24 2305 04/11/24 0058 04/11/24 0350 04/11/24 1635 04/12/24 0346 04/13/24 0348 04/14/24 0426 04/15/24 0458  NA 142   < > 139  --  143 140 142 142  K 3.3*   < > 3.4*  --  4.2 3.5 3.5 3.3*  CL 103   < > 107  --  108 104 105 102  CO2 23  --  22  --  23 23 23 26   GLUCOSE 132*   < > 88  --  96 85 87 90  BUN 45*   < > 47*  --  49* 50* 52* 53*  CREATININE 5.73*   < > 5.63*  --  6.20* 6.37* 6.20* 6.16*  CALCIUM 5.0*  --  4.9* 5.1 5.2* 5.1* 5.6* 6.0*  MG  --    < >  1.3*  --  1.5* 1.5* 1.7 1.6*  PHOS  --   --  5.5*  --  6.6*  --  6.5*  --   ALBUMIN 2.7*  --   --   --   --   --   --   --    < > = values in this interval not displayed.    Liver Function Tests: Recent Labs  Lab 04/10/24 2305  AST 20  ALT 27  ALKPHOS 88  BILITOT 0.2  PROT 6.2*  ALBUMIN 2.7*   No results for input(s): LIPASE, AMYLASE in the last 168 hours. No results for input(s): AMMONIA in the last 168 hours. CBC: Recent Labs    04/11/24 0106 04/11/24 0350 04/12/24 0346 04/13/24 0348 04/14/24 0426  HGB 10.5* 8.5* 8.5* 8.4* 8.4*  MCV  --  84.4 84.2 83.4 82.0  VITAMINB12  --  372  --   --   --   FOLATE  --  7.0  --   --   --   FERRITIN  --  65  --   --   --   TIBC  --  272  --   --   --   IRON  --  23*  --   --   --   RETICCTPCT  --  1.0  --   --   --     Cardiac Enzymes: Recent Labs  Lab 04/11/24 0350  CKTOTAL 472*   CBG: No results for  input(s): GLUCAP in the last 168 hours.  Iron Studies:  No results for input(s): IRON, TIBC, TRANSFERRIN, FERRITIN in the last 72 hours.  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 The study is intermediate risk.   No ST deviation was noted.   LV perfusion is abnormal. There is no evidence of ischemia. There is evidence of infarction. Defect 1: There is a medium defect with moderate reduction in uptake present in the apical to basal inferior and apex location(s) that is fixed. There is abnormal wall motion in the defect area. Consistent with infarction.   Left ventricular function is abnormal. Global function is moderately reduced. Nuclear stress EF: 31%. The left ventricular ejection fraction is moderately decreased (30-44%). End diastolic cavity size is severely enlarged. End systolic cavity size is severely enlarged. No evidence of transient ischemic dilation (TID) noted.   Prior study not available for comparison.     Medications: Infusions:    Scheduled Medications:  calcitRIOL  0.25 mcg Oral Daily   calcium acetate  667 mg Oral TID WC   darbepoetin (ARANESP) injection - DIALYSIS  60 mcg Subcutaneous Q Wed-1800   ferrous sulfate  325 mg Oral Q breakfast   furosemide  40 mg Oral BID   heparin  5,000 Units Subcutaneous Q8H   metoprolol succinate  50 mg Oral Daily   sodium chloride flush  3 mL Intravenous Q12H    have reviewed scheduled and prn medications.  Physical Exam: General:NAD, comfortable Heart:RRR, s1s2 nl Lungs:clear b/l, no crackle Abdomen:soft, Non-tender, non-distended Extremities: Bilateral leg edema improving chronic in nature Neurology: Alert awake and following commands   Brelee Renk Prasad Neshawn Aird 04/15/2024,10:48 AM  LOS: 4 days

## 2024-04-18 ENCOUNTER — Telehealth (HOSPITAL_BASED_OUTPATIENT_CLINIC_OR_DEPARTMENT_OTHER): Payer: Self-pay | Admitting: *Deleted

## 2024-04-18 NOTE — Telephone Encounter (Signed)
 Routing CRM to Dr. De Cuba for review.

## 2024-04-18 NOTE — Telephone Encounter (Signed)
 Copied from CRM [id number]. Topic: Appointments - Scheduling Inquiry for Clinic >> Apr 18, 2024 12:43 PM Sophia H wrote: Reason for CRM: Patient state he was discharged from the hospital on 11/16 and was needing a hospital follow up for renal failure within a week with primary care. Looks like soonest on our end was 01/05 with Dr. De Cuba, patient is wanting to know if there is any way he can be seen sooner in clinic, patient also has FMLA documents he needs filled out so he can return to work. Please reach out and advise # [phone]

## 2024-04-20 NOTE — Telephone Encounter (Signed)
 Pt was able to get scheduled with a different PCP office sooner. Cancelled the new pt appt with our office.

## 2024-04-24 ENCOUNTER — Telehealth (HOSPITAL_BASED_OUTPATIENT_CLINIC_OR_DEPARTMENT_OTHER): Payer: Self-pay | Admitting: Cardiovascular Disease

## 2024-04-24 DIAGNOSIS — N2581 Secondary hyperparathyroidism of renal origin: Secondary | ICD-10-CM | POA: Diagnosis not present

## 2024-04-24 DIAGNOSIS — N189 Chronic kidney disease, unspecified: Secondary | ICD-10-CM | POA: Diagnosis not present

## 2024-04-24 DIAGNOSIS — D631 Anemia in chronic kidney disease: Secondary | ICD-10-CM | POA: Diagnosis not present

## 2024-04-24 DIAGNOSIS — I120 Hypertensive chronic kidney disease with stage 5 chronic kidney disease or end stage renal disease: Secondary | ICD-10-CM | POA: Diagnosis not present

## 2024-04-24 DIAGNOSIS — N185 Chronic kidney disease, stage 5: Secondary | ICD-10-CM | POA: Diagnosis not present

## 2024-04-24 NOTE — Telephone Encounter (Signed)
 Returned call to patient.  I adv him to follow recommendations from hospital discharge.  He states he's not gone back to work yet, as his employer requires follow up with primary care and completion of FMLA paperwork.  Added cardiology appointment to wait list.  He is aware cardiology unable to clear him without having seen him since hospitalization.

## 2024-04-24 NOTE — Telephone Encounter (Signed)
  The patient was seen by Dr. Raford while he was in the hospital. He would like to know if there are any work restrictions he should follow.

## 2024-04-25 ENCOUNTER — Telehealth: Payer: Self-pay

## 2024-04-25 DIAGNOSIS — Z09 Encounter for follow-up examination after completed treatment for conditions other than malignant neoplasm: Secondary | ICD-10-CM | POA: Diagnosis not present

## 2024-04-25 DIAGNOSIS — D649 Anemia, unspecified: Secondary | ICD-10-CM | POA: Diagnosis not present

## 2024-04-25 DIAGNOSIS — I10 Essential (primary) hypertension: Secondary | ICD-10-CM | POA: Diagnosis not present

## 2024-04-25 DIAGNOSIS — N179 Acute kidney failure, unspecified: Secondary | ICD-10-CM | POA: Diagnosis not present

## 2024-04-25 DIAGNOSIS — N189 Chronic kidney disease, unspecified: Secondary | ICD-10-CM | POA: Diagnosis not present

## 2024-04-25 NOTE — Progress Notes (Signed)
 [facility] Name: Kenneth Hart Date of Birth: [birth year] MRN: [id number] Visit Date: 04/25/2024  Subjective Patient ID: Kenneth Hart is a 58 y.o. male.  Chief Complaint  Patient presents with  . Hospital follow up    Kenneth Hart presents for hospital admission follow up.  He was evaluated and treated at [facility] ED and admitted 04/10/2024 for renal failure unspecified.  Patient was hypertensive.    The following information was reviewed by members of the visit team:  Tobacco  Allergies  Meds  Med Hx  Surg Hx  Fam Hx      HPI  History of Present Illness The patient is a 58 year old male who presents to the employer-based clinic for a post-hospital follow-up before returning to work. He is currently being evaluated for hypocalcemia, hypokalemia, acute renal failure, hypertension, chronic kidney disease, and anemia. He was seen in the ED on 04/10/2024 for electrolyte imbalance and severe anemia. He was last seen in our clinic on 04/10/2024 for pain and swelling of the left lower leg, with initial impressions of a possible gout flare. He had no evidence of transaminitis but was hypocalcemic and hypertensive. He reports having a follow-up with nephrology on 04/24/2024, noted to have a 6-week follow-up, and has a follow-up with cardiology in December.  Patient was seen in our clinic on 04/10/2024 for pain and swelling of left lower leg.  Initial impressions were possible gout flare, of which patient had no past medical history of, and or acute versus chronic renal failure.  Labs were conducted to include CBC, CMP, uric acid, sed rate.  After labs resulted, patient was advised to immediately seek treatment at the ER due to hypocalcemia and severe anemia and an eGFR of 11.  Patient was seen in the ED and admitted 04/10/2024 for renal failure unspecified.  Patient was hypertensive.  Vital signs be BP 153/90, HR 82 afebrile and O2 sats 96%.  It was noted  that patient has a medical history significant for untreated hypertension and traumatic right hand amputation.  Assessment and plan was advanced renal failure CKD stage V likely secondary to uncontrolled hypertension.  A1c was 5.8.  Abdominal CT noted no hydronephrosis.  STD labs were taken to include hepatitis and HIV which were negative.  He was consulted by nephrology, which recommended IV Lasix , renal function and UOP.  It was noted that renal function and UOP remained since stable.  Nephrology follow-up was recommended.  Additionally follow-up for cardiology suggested.  Pulmonary edema was noted on chest CT.  Echocardiogram revealed ejection fraction of 40% with some wall motion abnormality.  There was some notes of nonsustained SVT.  It was reported that there was some evidence of infarction.    Imaging Studies: NM Myocar Multi W/Spect W/Wall Motion / EF Result Date: 04/14/2024 Findings are consistent with inferior infarction. The study is intermediate risk. No ST deviation was noted. LV perfusion is abnormal. There is no evidence of ischemia. There is evidence of infarction. Defect 1: There is a medium defect with moderate reduction in uptake present in the apical to basal inferior and apex location(s) that is fixed. There is abnormal wall motion in the defect area. Consistent with infarction. Left ventricular function is abnormal. Global function is moderately reduced. Nuclear stress EF: 31%. The left ventricular ejection fraction is moderately decreased (30-44%). End diastolic cavity size is severely enlarged. End systolic cavity size is severely enlarged. No evidence of transient ischemic dilation (TID) noted.  ECHOCARDIOGRAM COMPLETE Result Date: 04/11/2024  FINDINGS Left Ventricle: Left ventricular ejection fraction, by estimation, is 40 to 45%. Left ventricular ejection fraction by 2D MOD biplane is 43.0 %. The left ventricle has mildly decreased function. The left ventricle demonstrates  regional wall motion abnormalities. Moderate hypokinesis of the left ventricular, basal-mid inferoseptal wall, inferior wall and inferolateral wall. The left ventricular internal cavity size was mildly dilated. There is mild concentric left ventricular hypertrophy. Left ventricular diastolic parameters are consistent with Grade III diastolic dysfunction (restrictive). Elevated left atrial pressure. Right Ventricle: The right ventricular size is moderately enlarged. No increase in right ventricular wall thickness. Right ventricular systolic function is mildly reduced. Tricuspid regurgitation signal is inadequate for assessing PA pressure. Left Atrium: Left atrial size was moderately dilated. Right Atrium: Right atrial size was normal in size. Pericardium: There is no evidence of pericardial effusion. Mitral Valve: The mitral valve is normal in structure. Trivial mitral valve regurgitation. Tricuspid Valve: The tricuspid valve is normal in structure. Tricuspid valve regurgitation is trivial. Aortic Valve: The aortic valve is tricuspid. Aortic valve regurgitation is not visualized. Pulmonic Valve: The pulmonic valve was grossly normal. Pulmonic valve regurgitation is not visualized. Aorta: The aortic root and ascending aorta are structurally normal, with no evidence of dilitation. Venous: The inferior vena cava is dilated in size with less than 50% respiratory variability, suggesting right atrial pressure of 15 mmHg. IAS/Shunts: No atrial level shunt detected by color flow Dopple    CT Renal Stone Study Result Date: 04/11/2024 EXAM: CT UROGRAM 04/11/2024 01:50:27 AM  IMPRESSION: 1. Findings suggest congestive heart failure with 3rd spacing of fluid including pulmonary, mesenteric, and body wall edema and small right pleural effusion. Electronically signed by: [name] MD 04/11/2024       Labs: CBC: Recent Labs  Lab 04/10/24 2305 04/11/24 0106 04/11/24 0350 04/12/24 0346 04/13/24 0348  04/14/24 0426  WBC 13.6* -- 11.0* 10.0 9.0 9.9  HGB 9.0* 10.5* 8.5* 8.5* 8.4* 8.4*  HCT 30.9* 31.0* 29.3* 29.3* 28.7* 28.3*  MCV 84.7 -- 84.4 84.2 83.4 82.0  PLT 336 -- 309 317 291 305   Basic Metabolic Panel: Recent Labs  Lab 04/11/24 0350 04/11/24 1635 04/12/24 0346 04/13/24 0348 04/14/24 0426 04/15/24 0458  NA 139 -- 143 140 142 142  K 3.4* -- 4.2 3.5 3.5 3.3*  CL 107 -- 108 104 105 102  CO2 22 -- 23 23 23 26   GLUCOSE 88 -- 96 85 87 90  BUN 47* -- 49* 50* 52* 53*  CREATININE 5.63* -- 6.20* 6.37* 6.20* 6.16*  CALCIUM  4.9* 5.1 5.2* 5.1* 5.6* 6.0*  MG 1.3* -- 1.5* 1.5* 1.7 1.6*  PHOS 5.5* -- 6.6* -- 6.5* --   Liver Function Tests: Recent Labs  Lab 04/10/24 2305  AST 20  ALT 27  ALKPHOS 88  BILITOT 0.2  PROT 6.2*  ALBUMIN 2.7*   CBG: No results for input(s): GLUCAP in the last 168 hours.       Cardiology noted medical management to include metoprolol  XL 50 mg daily Norvasc  2.5 mg daily.  At discharge patient was also iron  supplementation (ferrous sulfate  325) daily, calcium  acetate 667 mg 3 times daily ( for hyperphosphatemia), furosemide  40 mg twice daily, amlodipine  2.5 mg daily and calcitrol 0.25 mcg (hypocalcemia).  At discharge again patient was advised to follow-up with nephrology, cardiology and PCP. He had a consultation with a nephrologist on 04/24/2024, who reported improved kidney function with no new abnormal findings.SABRA He reports that he  was informed that His laboratory results were stable or slightly improved. He reports feeling significantly better than he did a few weeks ago when he experienced dyspnea and nausea after walking short distances.  Occupation: Insurance account manager at [employer] Tobacco: Smokes cigarettes but not daily Living Condition: Lives in [place]  with family  FAMILY HISTORY - Mother was diabetic and passed away in 06-03-2015 due to a diabetic coma   Review of Systems  Constitutional:  Positive for diaphoresis. Negative for  activity change, appetite change, fatigue (improved) and unexpected weight change.  HENT:  Negative for trouble swallowing.   Eyes:  Positive for visual disturbance.  Respiratory:  Negative for cough, chest tightness, shortness of breath and wheezing.   Cardiovascular:  Positive for leg swelling. Negative for chest pain and palpitations.  Gastrointestinal:  Negative for abdominal pain, diarrhea, nausea and vomiting.  Genitourinary:  Positive for decreased urine volume (Pt notes that this has been his new baseline since being hospitalized). Negative for difficulty urinating, dysuria, enuresis, flank pain, frequency, hematuria, scrotal swelling and urgency.  Skin:  Negative for wound.  Allergic/Immunologic: Negative for immunocompromised state.  Neurological:  Negative for dizziness, tremors, speech difficulty, weakness and headaches.  Psychiatric/Behavioral:  Negative for confusion and sleep disturbance.   All other systems reviewed and are negative.    Behavioral Health Screening  Patient Health Questionnaire-2 Score: 0 (04/25/2024  1:39 PM)      Patient's Depression screening is Negative   Depression Plan: Normal/Negative Screening  Vitals: BP: (!) 146/94, Temp: 97.3 F (36.3 C), Temp Source: Temporal, Heart Rate: 88, Resp: 18, SpO2: 98 %, Height: 1.651 m (5' 5), Weight: 108 kg (238 lb); BMI (Calculated): 39.6  Objective   Physical Exam Vitals and nursing note reviewed.  Constitutional:      General: He is not in acute distress.    Appearance: He is well-groomed. He is obese. He is not ill-appearing, toxic-appearing or diaphoretic.  HENT:     Head: No right periorbital erythema or left periorbital erythema.  Eyes:     Pupils: Pupils are equal, round, and reactive to light.     Right eye: Pupil is not sluggish.     Left eye: Pupil is not sluggish.  Neck:     Thyroid: No thyromegaly.     Vascular: No carotid bruit, hepatojugular reflux or JVD.  Cardiovascular:     Rate  and Rhythm: Normal rate and regular rhythm.     Heart sounds: Normal heart sounds, S1 normal and S2 normal. No murmur heard. Pulmonary:     Effort: Pulmonary effort is normal. No accessory muscle usage.     Breath sounds: Normal breath sounds. No rhonchi or rales.  Abdominal:     General: Abdomen is protuberant. Bowel sounds are normal. There is no distension.     Palpations: There is no shifting dullness, fluid wave, hepatomegaly or splenomegaly.     Tenderness: There is no abdominal tenderness.  Musculoskeletal:     Right lower leg: 3+ Edema present.     Left lower leg: 3+ Edema present.  Neurological:     General: No focal deficit present.     Mental Status: He is alert and oriented to person, place, and time.     GCS: GCS eye subscore is 4. GCS verbal subscore is 5. GCS motor subscore is 6.     Cranial Nerves: Cranial nerves 2-12 are intact. No facial asymmetry.     Sensory: Sensation is intact.  Motor: Motor function is intact.     Coordination: Coordination is intact. Rapid alternating movements normal.     Gait: Gait is intact.  Psychiatric:        Behavior: Behavior is cooperative.     Physical Exam Respiratory: Clear to auscultation, no wheezing, rales or rhonchi Cardiovascular: Regular rate and rhythm, no murmurs, rubs, or gallops Extremities: Calf circumference is 19 inches, non-pitting edema noted Skin: Skin is warm    Office Visit on 04/25/2024  Component Date Value  . Hemoglobin A1c 04/25/2024 5.8 (H)   . Estimated Average Glucose 04/25/2024 120   . Sodium 04/25/2024 141   . Potassium 04/25/2024 3.6   . Chloride 04/25/2024 102   . CO2 04/25/2024 27   . Anion Gap 04/25/2024 12   . Glucose, Random 04/25/2024 92   . Blood Urea Nitrogen (BUN) 04/25/2024 75 (H)   . Creatinine 04/25/2024 6.74 (H)   . eGFR 04/25/2024 9 (L)   . Albumin 04/25/2024 3.6   . Total Protein 04/25/2024 6.8   . Bilirubin, Total 04/25/2024 0.3   . Alkaline Phosphatase (AL* 04/25/2024  124 (H)   . Aspartate Aminotransfera* 04/25/2024 25   . Alanine Aminotransferase* 04/25/2024 35   . Calcium  04/25/2024 6.1 (L)   . BUN/Creatinine Ratio 04/25/2024 11.1   . Iron  04/25/2024 37 (L)   . Transferrin 04/25/2024 243   . Ferritin 04/25/2024 136   . Total Iron  Binding Capac* 04/25/2024 347   . Transferrin Saturation 04/25/2024 11 (L)   . WBC 04/25/2024 7.80   . RBC 04/25/2024 3.87 (L)   . Hemoglobin 04/25/2024 9.6 (L)   . Hematocrit 04/25/2024 30.7 (L)   . Mean Corpuscular Volume * 04/25/2024 79.5 (L)   . Mean Corpuscular Hemoglo* 04/25/2024 24.9 (L)   . Mean Corpuscular Hemoglo* 04/25/2024 31.3 (L)   . Red Cell Distribution Wi* 04/25/2024 20.4 (H)   . Platelet Count (PLT) 04/25/2024 250   . Mean Platelet Volume (MP* 04/25/2024 8.4   . Neutrophils % 04/25/2024 70   . Lymphocytes % 04/25/2024 13   . Monocytes % 04/25/2024 12   . Eosinophils % 04/25/2024 4   . Basophils % 04/25/2024 1   . nRBC % 04/25/2024 0   . Neutrophils Absolute 04/25/2024 5.50   . Lymphocytes # 04/25/2024 1.00   . Monocytes # 04/25/2024 0.90 (H)   . Eosinophils # 04/25/2024 0.30   . Basophils # 04/25/2024 0.10   . nRBC Absolute 04/25/2024 0.00   . Albumin, Urine 04/25/2024 80   . Creatinine, Urine 04/25/2024 70   . Albumin/Creatinine Ratio* 04/25/2024 114 (H)   . Color, Urine 04/25/2024 Yellow   . Clarity, Urine 04/25/2024 Clear   . Glucose, Urine 04/25/2024 Negative   . Bilirubin, Urine 04/25/2024 Negative   . Ketones, Urine 04/25/2024 Negative   . Specific Gravity, Urine 04/25/2024 1.015   . Blood, Urine 04/25/2024 Trace (A)   . pH, Urine 04/25/2024 6.0   . Protein, Urine 04/25/2024 100 (A)   . Urobilinogen, Urine 04/25/2024 0.2   . Nitrite, Urine 04/25/2024 Negative   . Leukocyte Esterase, Urine 04/25/2024 Negative   . Kit/Device Lot # 04/25/2024 [id number]   . Kit/Device Expiration Da* 04/25/2024 [id number]   Office Visit on 04/10/2024  Component Date Value  . Sodium 04/10/2024 142   .  Potassium 04/10/2024 3.4 (L)   . Chloride 04/10/2024 108 (H)   . CO2 04/10/2024 25   . Anion Gap 04/10/2024 9   . Glucose, Random 04/10/2024  90   . Blood Urea Nitrogen (BUN) 04/10/2024 41 (H)   . Creatinine 04/10/2024 5.64 (H)   . eGFR 04/10/2024 11 (L)   . Albumin 04/10/2024 3.2 (L)   . Total Protein 04/10/2024 5.8 (L)   . Bilirubin, Total 04/10/2024 0.4   . Alkaline Phosphatase (AL* 04/10/2024 88   . Aspartate Aminotransfera* 04/10/2024 10 (L)   . Alanine Aminotransferase* 04/10/2024 21   . Calcium  04/10/2024 5.0 (LL)   . BUN/Creatinine Ratio 04/10/2024 7.3 (L)   . Corrected Calcium  04/10/2024 5.6   . Uric Acid 04/10/2024 9.5 (H)   . Sedimentation Rate 04/10/2024 16   . Hemoglobin A1c 04/10/2024 5.9 (H)   . Estimated Average Glucose 04/10/2024 123   . WBC 04/10/2024 11.50 (H)   . RBC 04/10/2024 3.60 (L)   . Hemoglobin 04/10/2024 8.8 (L)   . Hematocrit 04/10/2024 28.2 (L)   . Mean Corpuscular Volume * 04/10/2024 78.4 (L)   . Mean Corpuscular Hemoglo* 04/10/2024 24.4 (L)   . Mean Corpuscular Hemoglo* 04/10/2024 31.1 (L)   . Red Cell Distribution Wi* 04/10/2024 19.2 (H)   . Platelet Count (PLT) 04/10/2024 315   . Mean Platelet Volume (MP* 04/10/2024 8.2   . Neutrophils % 04/10/2024 80   . Lymphocytes % 04/10/2024 7   . Monocytes % 04/10/2024 10   . Eosinophils % 04/10/2024 2   . Basophils % 04/10/2024 1   . nRBC % 04/10/2024 0   . Neutrophils Absolute 04/10/2024 9.20 (H)   . Lymphocytes # 04/10/2024 0.90 (L)   . Monocytes # 04/10/2024 1.20 (H)   . Eosinophils # 04/10/2024 0.20   . Basophils # 04/10/2024 0.10   . nRBC Absolute 04/10/2024 0.00    UP TO DATE LABS 04/25/2024 Office Visit on 04/25/2024  Component Date Value  . Hemoglobin A1c 04/25/2024 5.8 (H)   . Estimated Average Glucose 04/25/2024 120   . Sodium 04/25/2024 141   . Potassium 04/25/2024 3.6   . Chloride 04/25/2024 102   . CO2 04/25/2024 27   . Anion Gap 04/25/2024 12   . Glucose, Random 04/25/2024 92    . Blood Urea Nitrogen (BUN) 04/25/2024 75 (H)   . Creatinine 04/25/2024 6.74 (H)   . eGFR 04/25/2024 9 (L)   . Albumin 04/25/2024 3.6   . Total Protein 04/25/2024 6.8   . Bilirubin, Total 04/25/2024 0.3   . Alkaline Phosphatase (AL* 04/25/2024 124 (H)   . Aspartate Aminotransfera* 04/25/2024 25   . Alanine Aminotransferase* 04/25/2024 35   . Calcium  04/25/2024 6.1 (L)   . BUN/Creatinine Ratio 04/25/2024 11.1   . Iron  04/25/2024 37 (L)   . Transferrin 04/25/2024 243   . Ferritin 04/25/2024 136   . Total Iron  Binding Capac* 04/25/2024 347   . Transferrin Saturation 04/25/2024 11 (L)   . WBC 04/25/2024 7.80   . RBC 04/25/2024 3.87 (L)   . Hemoglobin 04/25/2024 9.6 (L)   . Hematocrit 04/25/2024 30.7 (L)   . Mean Corpuscular Volume * 04/25/2024 79.5 (L)   . Mean Corpuscular Hemoglo* 04/25/2024 24.9 (L)   . Mean Corpuscular Hemoglo* 04/25/2024 31.3 (L)   . Red Cell Distribution Wi* 04/25/2024 20.4 (H)   . Platelet Count (PLT) 04/25/2024 250   . Mean Platelet Volume (MP* 04/25/2024 8.4   . Neutrophils % 04/25/2024 70   . Lymphocytes % 04/25/2024 13   . Monocytes % 04/25/2024 12   . Eosinophils % 04/25/2024 4   . Basophils % 04/25/2024 1   .  nRBC % 04/25/2024 0   . Neutrophils Absolute 04/25/2024 5.50   . Lymphocytes # 04/25/2024 1.00   . Monocytes # 04/25/2024 0.90 (H)   . Eosinophils # 04/25/2024 0.30   . Basophils # 04/25/2024 0.10   . nRBC Absolute 04/25/2024 0.00   . Albumin, Urine 04/25/2024 80   . Creatinine, Urine 04/25/2024 70   . Albumin/Creatinine Ratio* 04/25/2024 114 (H)   . Color, Urine 04/25/2024 Yellow   . Clarity, Urine 04/25/2024 Clear   . Glucose, Urine 04/25/2024 Negative   . Bilirubin, Urine 04/25/2024 Negative   . Ketones, Urine 04/25/2024 Negative   . Specific Gravity, Urine 04/25/2024 1.015   . Blood, Urine 04/25/2024 Trace (A)   . pH, Urine 04/25/2024 6.0   . Protein, Urine 04/25/2024 100 (A)   . Urobilinogen, Urine 04/25/2024 0.2   . Nitrite,  Urine 04/25/2024 Negative   . Leukocyte Esterase, Urine 04/25/2024 Negative   . Kit/Device Lot # 04/25/2024 [id number]   . Kit/Device Expiration Da* 04/25/2024 [id number]      Assessment 1. Morbid obesity with body mass index (BMI) of 40.0 or higher (CMD) (Primary) - Hemoglobin A1C With Estimated Average Glucose  2. Chronic kidney disease, unspecified CKD stage - Hemoglobin A1C With Estimated Average Glucose - Albumin, Random Urine - POC Urinalysis without Microscopic (Manual Read) - enalapril (VASOTEC) 5 mg tablet; Take 0.5 tablets (2.5 mg total) by mouth daily.  Dispense: 23 tablet; Refill: 0 - XR Chest 1 View; Future - amLODIPine  (NORVASC ) 2.5 mg tablet; Take 2 tablets (5 mg total) by mouth daily.  Dispense: 60 tablet; Refill: 0 - ferrous gluconate 324 mg (38 mg iron ) tablet; Take 1 tablet (324 mg total) by mouth daily.  Dispense: 90 tablet; Refill: 0  3. Hospital discharge follow-up - Comprehensive Metabolic Panel - CBC with Differential - Anemia Profile - POC Urinalysis without Microscopic (Manual Read)  4. Hypertension, unspecified type - Comprehensive Metabolic Panel - Albumin, Random Urine - enalapril (VASOTEC) 5 mg tablet; Take 0.5 tablets (2.5 mg total) by mouth daily.  Dispense: 23 tablet; Refill: 0 - XR Chest 1 View; Future - Ambulatory referral to Optometry; Future - amLODIPine  (NORVASC ) 2.5 mg tablet; Take 2 tablets (5 mg total) by mouth daily.  Dispense: 60 tablet; Refill: 0  5. Acute renal failure, unspecified acute renal failure type - Comprehensive Metabolic Panel - CBC with Differential - Albumin, Random Urine - POC Urinalysis without Microscopic (Manual Read) - enalapril (VASOTEC) 5 mg tablet; Take 0.5 tablets (2.5 mg total) by mouth daily.  Dispense: 23 tablet; Refill: 0 - Ambulatory referral to Optometry; Future - amLODIPine  (NORVASC ) 2.5 mg tablet; Take 2 tablets (5 mg total) by mouth daily.  Dispense: 60 tablet; Refill: 0 - ferrous gluconate 324 mg (38 mg  iron ) tablet; Take 1 tablet (324 mg total) by mouth daily.  Dispense: 90 tablet; Refill: 0  6. Hypocalcemia - Comprehensive Metabolic Panel  7. Hypokalemia - Comprehensive Metabolic Panel  8. Anemia, unspecified type - CBC with Differential - Anemia Profile - ferrous gluconate 324 mg (38 mg iron ) tablet; Take 1 tablet (324 mg total) by mouth daily.  Dispense: 90 tablet; Refill: 0  9. History of pleural effusion - XR Chest 1 View; Future  10. Acute on chronic HFrEF (heart failure with reduced ejection fraction)    (CMD) - XR Chest 1 View; Future - Ambulatory referral to Optometry; Future - amLODIPine  (NORVASC ) 2.5 mg tablet; Take 2 tablets (5 mg total) by mouth daily.  Dispense: 60 tablet;  Refill: 0  11. History of blurred vision - Ambulatory referral to Optometry; Future   Plan  Assessment & Plan 1. Hypertension: Elevated. - Continue furosemide  (80 mg, 40 mg twice daily) for 1 to 2 weeks, then possible consideration for discontinuation replace with an alternative diuretic (chlorthalidone).  - Increase amlodipine  from 2.5 mg to 5 mg daily. - Add enalapril.  Start initial dose of 2.5 mg daily - Monitor blood pressure three times daily - morning, noon, and night. - Maintain a strict low-sodium diet and use salt substitutes. - Document dietary sodium intake. - Referral to optometry for a dilated eye exam. - Abstain from smoking.  Discussed cussed with patient refraining from smoking (cold turkey).  Patient reports it since his discharge from hospital and while in the hospital he was able to use restraint and returning to regular smoking.  Advised patient to continue this trend without the use of supplementing with additional nicotine at this time. - On exam +2, +3 edema noted bilaterally lower extremities, no pitting.  Wear compression stockings daily. - Lung sounds improved, as well as improvement in exertion without notable dyspnea.  On exam lungs clear to auscultation with no  evidence of crackles.  Will order chest x-ray for reevaluation of right-sided pulmonary edema. - Discontinue new antihypertensive medication and notify the clinic if experiencing dry cough, lip tingling, Anuria or tongue swelling.  2. Chronic Kidney Disease versus AKI: Likely secondary to hypertension. - Continue current medications, including calcitriol  and calcium  supplements and continue to monitor electrolytes.  Electrolytes have improved since hospital visit 04/11/19/2025 - Labs ordered to monitor kidney function and A1c levels.  Updated labs (04/25/2024) note there has been an increase in serum creatinine up from 5.63-6.74, prior to starting enalapril.  Will continue to monitor this - Contact nephrologist for a clearance letter to assist primary care provider in completing FMLA paperwork.  3. Anemia: Severe. - H & H levels at 8.8 mg/dL and 71.7% on 88/89/7974. - Start ferrous gluconate 325 mg daily.  Will Assess that - Continue monitoring hemoglobin and hematocrit levels.  Follow-up - Scheduled for a follow-up visit next Friday (Dec 5th, 2025).  TotalTime   I have personally spent 61 minutes involved in face-to-face and non-face-to-face activities for this patient on the day of the visit.  Professional time spent includes chart review, obtaining history, physical exam, counseling, documenting, care coordination    Electronically signed: Rushdan Deatrice Majors, DNP 04/26/2024  9:30 AM   (This note has been created with voice recognition software. While this note has been edited for accuracy, the software periodically misinterprets speech resulting in errors that might not have been caught in editing. The chart was scanned for gross errors, however minor errors may still remain.)

## 2024-04-25 NOTE — Progress Notes (Signed)
 Mr. Actually called clinic today for a post ED hospitalization follow-up for possible acute renal failure and anemia.  Patient was verified by name date of birth and address.  Patient was seen in our clinic on 04/10/2024 for pain and swelling of left lower leg.  Initial impressions were possible gout flare, of which patient had no past medical history of, and or acute versus chronic renal failure.  Labs were conducted to include CBC, CMP, uric acid, sed rate.  After labs resulted, patient was advised to immediately seek treatment at the ER due to hypocalcemia and severe anemia and an eGFR of 11.  Patient was seen in the ED and admitted 04/10/2024 for renal failure unspecified.  Patient was hypertensive.  Vital signs be BP 153/90, HR 82 afebrile and O2 sats 96%.  It was noted that patient has a medical history significant for untreated hypertension and traumatic right hand amputation.  Assessment and plan was advanced renal failure CKD stage V likely secondary to uncontrolled hypertension.  A1c was 5.8.  Abdominal CT noted no hydronephrosis.  STD labs were taken to include hepatitis and HIV which were negative.  He was consulted by nephrology, which recommended IV Lasix , renal function and UOP.  It was noted that renal function and UOP remained since stable.  Nephrology follow-up was recommended.  Additionally follow-up for cardiology suggested.  Pulmonary edema was noted on chest CT.  Echocardiogram revealed ejection fraction of 40% with some wall motion abnormality.  There was some notes of nonsustained SVT.  It was reported that there was some evidence of infarction.   Imaging Studies: NM Myocar Multi W/Spect W/Wall Motion / EF Result Date: 04/14/2024 Findings are consistent with inferior infarction. The study is intermediate risk. No ST deviation was noted. LV perfusion is abnormal. There is no evidence of ischemia. There is evidence of infarction. Defect 1: There is a medium defect with moderate reduction  in uptake present in the apical to basal inferior and apex location(s) that is fixed. There is abnormal wall motion in the defect area. Consistent with infarction. Left ventricular function is abnormal. Global function is moderately reduced. Nuclear stress EF: 31%. The left ventricular ejection fraction is moderately decreased (30-44%). End diastolic cavity size is severely enlarged. End systolic cavity size is severely enlarged. No evidence of transient ischemic dilation (TID) noted.   ECHOCARDIOGRAM COMPLETE Result Date: 04/11/2024  FINDINGS Left Ventricle: Left ventricular ejection fraction, by estimation, is 40 to 45%. Left ventricular ejection fraction by 2D MOD biplane is 43.0 %. The left ventricle has mildly decreased function. The left ventricle demonstrates regional wall motion abnormalities. Moderate hypokinesis of the left ventricular, basal-mid inferoseptal wall, inferior wall and inferolateral wall. The left ventricular internal cavity size was mildly dilated. There is mild concentric left ventricular hypertrophy. Left ventricular diastolic parameters are consistent with Grade III diastolic dysfunction (restrictive). Elevated left atrial pressure. Right Ventricle: The right ventricular size is moderately enlarged. No increase in right ventricular wall thickness. Right ventricular systolic function is mildly reduced. Tricuspid regurgitation signal is inadequate for assessing PA pressure. Left Atrium: Left atrial size was moderately dilated. Right Atrium: Right atrial size was normal in size. Pericardium: There is no evidence of pericardial effusion. Mitral Valve: The mitral valve is normal in structure. Trivial mitral valve regurgitation. Tricuspid Valve: The tricuspid valve is normal in structure. Tricuspid valve regurgitation is trivial. Aortic Valve: The aortic valve is tricuspid. Aortic valve regurgitation is not visualized. Pulmonic Valve: The pulmonic valve was grossly normal. Pulmonic valve  regurgitation is not visualized. Aorta: The aortic root and ascending aorta are structurally normal, with no evidence of dilitation. Venous: The inferior vena cava is dilated in size with less than 50% respiratory variability, suggesting right atrial pressure of 15 mmHg. IAS/Shunts: No atrial level shunt detected by color flow Dopple   CT Renal Stone Study Result Date: 04/11/2024 EXAM: CT UROGRAM 04/11/2024 01:50:27 AM  IMPRESSION: 1. Findings suggest congestive heart failure with 3rd spacing of fluid including pulmonary, mesenteric, and body wall edema and small right pleural effusion. Electronically signed by: [name] MD 04/11/2024     Labs: CBC: Recent Labs  Lab 04/10/24 2305 04/11/24 0106 04/11/24 0350 04/12/24 0346 04/13/24 0348 04/14/24 0426  WBC 13.6* -- 11.0* 10.0 9.0 9.9  HGB 9.0* 10.5* 8.5* 8.5* 8.4* 8.4*  HCT 30.9* 31.0* 29.3* 29.3* 28.7* 28.3*  MCV 84.7 -- 84.4 84.2 83.4 82.0  PLT 336 -- 309 317 291 305   Basic Metabolic Panel: Recent Labs  Lab 04/11/24 0350 04/11/24 1635 04/12/24 0346 04/13/24 0348 04/14/24 0426 04/15/24 0458  NA 139 -- 143 140 142 142  K 3.4* -- 4.2 3.5 3.5 3.3*  CL 107 -- 108 104 105 102  CO2 22 -- 23 23 23 26   GLUCOSE 88 -- 96 85 87 90  BUN 47* -- 49* 50* 52* 53*  CREATININE 5.63* -- 6.20* 6.37* 6.20* 6.16*  CALCIUM  4.9* 5.1 5.2* 5.1* 5.6* 6.0*  MG 1.3* -- 1.5* 1.5* 1.7 1.6*  PHOS 5.5* -- 6.6* -- 6.5* --   Liver Function Tests: Recent Labs  Lab 04/10/24 2305  AST 20  ALT 27  ALKPHOS 88  BILITOT 0.2  PROT 6.2*  ALBUMIN 2.7*   CBG: No results for input(s): GLUCAP in the last 168 hours.     Cardiology noted medical management to include metoprolol  XL 50 mg daily Norvasc  2.5 mg daily.  At discharge patient was also iron  supplementation (ferrous sulfate  325) daily, calcium  acetate 667 mg 3 times daily ( for hyperphosphatemia), furosemide  40 mg twice daily, amlodipine  2.5 mg daily and calcitrol 0.25 mcg (hypocalcemia).   At discharge again patient was advised to follow-up with nephrology, cardiology and PCP.  Patient scheduled today 04/25/2024 at 1:30 pm for follow-up.

## 2024-04-25 NOTE — Telephone Encounter (Signed)
 Form received and placed in my folder until pts appt

## 2024-04-25 NOTE — Progress Notes (Signed)
  Behavioral Health Screening  Patient Health Questionnaire-2 Score: 0 (04/25/2024  1:39 PM)      Patient's Depression screening/score = Negative

## 2024-04-25 NOTE — Telephone Encounter (Signed)
 Reason for CRM: Pt was discharged from hospital 04/16/24. He has an upcoming new pt appt with Dr. Sherlynn 12/2. He would like to be seen sooner if possible since he can't return to work until he has been seen. Pls let pt know if it's possible to be seen sooner.   Spoke to patient, unfortunately no openings due to Thanksgiving holiday this week. He said okay I will see you on Tuesday. I confirmed appt time with him, asked him to check in 15 minutes early for new patient paperwork. He understood.

## 2024-04-25 NOTE — Telephone Encounter (Signed)
 Type of forms received: FMLA  Routed to: Team [employer] received by :  fax   Individual made aware of 5-7 business day turn around (Y/N): n/a  Form completed and patient made aware of charges(Y/N): n/a  Form location:  [place] inbox front office   **patient has new patient on 12/2, patient aware cannot complete form until appt**

## 2024-05-02 ENCOUNTER — Ambulatory Visit: Admitting: Sports Medicine

## 2024-05-02 NOTE — Telephone Encounter (Signed)
 Called and left pt a vm. Dr Sherlynn will not be able to complete FMLA paperwork

## 2024-05-03 ENCOUNTER — Telehealth (HOSPITAL_COMMUNITY): Payer: Self-pay | Admitting: Pharmacist

## 2024-05-03 DIAGNOSIS — D631 Anemia in chronic kidney disease: Secondary | ICD-10-CM | POA: Insufficient documentation

## 2024-05-03 NOTE — Telephone Encounter (Signed)
 Patient referred to infusion pharmacy team for Venofer  from Dr. Dolan [facility])  Site of care - Site of care: [facility] Dx code - D63.1 IV Iron  Therapy - Venofer  200mg  IV x 3 doses Infusion appointments - Scheduling team will schedule patient as soon as possible.   Sherry Pennant, PharmD, MPH, BCPS, CPP Clinical Pharmacist [facility])

## 2024-05-04 ENCOUNTER — Encounter: Payer: Self-pay | Admitting: Nephrology

## 2024-05-04 ENCOUNTER — Other Ambulatory Visit (HOSPITAL_COMMUNITY): Payer: Self-pay | Admitting: Nephrology

## 2024-05-05 ENCOUNTER — Telehealth: Payer: Self-pay

## 2024-05-05 NOTE — Telephone Encounter (Signed)
 Dr. Dolan, patient will be scheduled as soon as possible.  Auth Submission: NO AUTH NEEDED Site of care: Site of care: [facility] Payer: BCBS commercial Medication & CPT/J Code(s) submitted: Venofer  (Iron  Sucrose) J1756 Diagnosis Code:  Route of submission (phone, fax, portal): phone Phone # [phone] Fax # Auth type: Buy/Bill PB Units/visits requested: 200mg  x 3 doses Reference number:  Approval from: 05/05/24 to 05/31/24

## 2024-05-08 DIAGNOSIS — I5023 Acute on chronic systolic (congestive) heart failure: Secondary | ICD-10-CM | POA: Diagnosis not present

## 2024-05-09 ENCOUNTER — Ambulatory Visit (HOSPITAL_BASED_OUTPATIENT_CLINIC_OR_DEPARTMENT_OTHER): Admitting: Family

## 2024-05-09 ENCOUNTER — Telehealth: Payer: Self-pay

## 2024-05-09 NOTE — Progress Notes (Deleted)
  Cardiology Office Note   Date:  05/09/2024  ID:  Kenneth Hart, DOB 12-16-1965, MRN [id number] PCP: Kenneth Hart  [facility] Providers Cardiologist:  Annabella Scarce, MD { Click to update primary MD,subspecialty MD or APP then REFRESH:1}    History of Present Illness Kenneth Hart is a 58 y.o. male with hx of HTN, traumatic right hand amputation, anemia of chronic disease, iron  deficiency, prolonged QT interval, HFmrEF, hypocalcemia, CKD5  Admitted 11/10-11/15/25 after presenting with creatinine 5.64 after labs collected by PCP (had not had lab work in 10 years). CKD attributed to uncontrolled, untreated hypertension. Required IV Lasix . 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 LHC deferred due to renal function. Lexiscan  myoview  with evidence of infarction, medical management recommended. Runs of NSVT noted, metoprolol  initiated. Discharge regimen included Amlodipine  2.5mg  daily, aspirin  81mg  daily, furosemide  40mg  BID, metoprolol  succiante 50mg  daily.   The 10-year ASCVD risk score (Arnett DK, et al., 2019) is: 20%. ***  ROS: ***  Studies Reviewed      *** Risk Assessment/Calculations {Does this patient have ATRIAL FIBRILLATION?[phone]} No BP recorded.  {Refresh Note OR Click here to enter BP  :1}***       Physical Exam VS:  There were no vitals taken for this visit.       Wt Readings from Last 3 Encounters:  04/15/24 236 lb 12.4 oz (107.4 kg)    GEN: Well nourished, well developed in no acute distress NECK: No JVD; No carotid bruits CARDIAC: ***RRR, no murmurs, rubs, gallops RESPIRATORY:  Clear to auscultation without rales, wheezing or rhonchi  ABDOMEN: Soft, non-tender, non-distended EXTREMITIES:  No edema; No deformity   ASSESSMENT AND PLAN  CKD 5 -  HFmrEF -  Presumed CAD *** -  Anemia -     {Are you ordering a CV Procedure (e.g. stress test, cath, DCCV, TEE, etc)?   Press F2        :[id number]}  Dispo: ***  Signed, Reche GORMAN Finder, NP

## 2024-05-09 NOTE — Telephone Encounter (Signed)
 Auth Submission: DENIED Site of care: Site of care: [facility] Payer: BCBS commercial Medication & CPT/J Code(s) submitted: Retacrit Diagnosis Code:  Route of submission (phone, fax, portal): fax  Authorization has been DENIED because if he is not on dialysis his hemoglobin must be less than 10g/dl that is steadily decreasing.  Provider is aware, denial letter is in the media tab.

## 2024-05-09 NOTE — Addendum Note (Signed)
 Addended by: DAYNE SHERRY RAMAN on: 05/09/2024 08:13 AM   Modules accepted: Orders

## 2024-05-11 ENCOUNTER — Encounter (HOSPITAL_BASED_OUTPATIENT_CLINIC_OR_DEPARTMENT_OTHER): Payer: Self-pay

## 2024-05-11 ENCOUNTER — Ambulatory Visit

## 2024-05-11 MED ORDER — IRON SUCROSE 200 MG IVPB - SIMPLE MED: 200.0000 mg | Freq: Once | Status: AC

## 2024-05-12 ENCOUNTER — Ambulatory Visit

## 2024-05-12 ENCOUNTER — Ambulatory Visit (HOSPITAL_BASED_OUTPATIENT_CLINIC_OR_DEPARTMENT_OTHER): Admitting: Family

## 2024-05-12 VITALS — BP 117/77 | HR 71 | Temp 97.8°F | Resp 18 | Ht 66.0 in | Wt 236.4 lb

## 2024-05-12 DIAGNOSIS — D631 Anemia in chronic kidney disease: Secondary | ICD-10-CM

## 2024-05-12 DIAGNOSIS — N185 Chronic kidney disease, stage 5: Secondary | ICD-10-CM

## 2024-05-12 MED ORDER — IRON SUCROSE 200 MG IVPB - SIMPLE MED
200.0000 mg | Freq: Once | Status: AC
  Administered 2024-05-12: 200 mg via INTRAVENOUS
  Filled 2024-05-12: qty 110

## 2024-05-12 NOTE — Progress Notes (Deleted)
°  Cardiology Office Note   Date:  05/12/2024  ID:  Kenneth Hart, DOB 1965/12/06, MRN [id number] PCP: Freddrick Johns  [facility] Providers Cardiologist:  Annabella Scarce, MD { Click to update primary MD,subspecialty MD or APP then REFRESH:1}    History of Present Illness Kenneth Hart is a 58 y.o. male with hx of HTN, traumatic right hand amputation, anemia of chronic disease, iron  deficiency, prolonged QT interval, HFmrEF, hypocalcemia, CKD5  Admitted 11/10-11/15/25 after presenting with creatinine 5.64 after labs collected by PCP (had not had lab work in 10 years). CKD attributed to uncontrolled, untreated hypertension. Required IV Lasix . New onset HFmrEF with echo 04/15/24 LVEF 40-45%, gr3dd, mildly reduced RVSF, RV moderately enlarged, trivial MR. LHC deferred due to renal function. Lexiscan  myoview  04/14/24 with evidence of infarction, medical management recommended. Runs of NSVT noted, metoprolol  initiated. Discharge regimen included Amlodipine  2.5mg  daily, aspirin  81mg  daily, furosemide  40mg  BID, metoprolol  succiante 50mg  daily.   The 10-year ASCVD risk score (Arnett DK, et al., 2019) is: 20%. ***  ROS: Please see the history of present illness.    All other systems reviewed and are negative.   Studies Reviewed      *** Risk Assessment/Calculations   No BP recorded.  {Refresh Note OR Click here to enter BP  :1}***       Physical Exam VS:  There were no vitals taken for this visit.       Wt Readings from Last 3 Encounters:  04/15/24 236 lb 12.4 oz (107.4 kg)    GEN: Well nourished, well developed in no acute distress NECK: No JVD; No carotid bruits CARDIAC: ***RRR, no murmurs, rubs, gallops RESPIRATORY:  Clear to auscultation without rales, wheezing or rhonchi  ABDOMEN: Soft, non-tender, non-distended EXTREMITIES:  No edema; No deformity   ASSESSMENT AND PLAN  CKD 5 -   HFmrEF -   Presumed CAD *** -   Anemia -     {Are you ordering a CV Procedure  (e.g. stress test, cath, DCCV, TEE, etc)?   Press F2        :[id number]}  Dispo: ***  Signed, Reche GORMAN Finder, NP

## 2024-05-12 NOTE — Progress Notes (Signed)
 Diagnosis: , Acute Anemia  Provider:  Lonna Coder MD  Procedure: IV Infusion  IV Type: Peripheral, IV Location: L Hand  , Venofer  (Iron  Sucrose), Dose: 200 mg  Infusion Start Time: 1305  Infusion Stop Time: 1325  Post Infusion IV Care: Observation period completed and Peripheral IV Discontinued  Discharge: Condition: Good, Destination: Home . AVS Provided  Performed by:  Donny Childes, RN

## 2024-05-12 NOTE — Patient Instructions (Signed)

## 2024-05-18 ENCOUNTER — Ambulatory Visit

## 2024-05-18 VITALS — BP 156/95 | HR 93 | Temp 98.4°F | Resp 18 | Ht 66.0 in | Wt 236.0 lb

## 2024-05-18 DIAGNOSIS — N185 Chronic kidney disease, stage 5: Secondary | ICD-10-CM

## 2024-05-18 DIAGNOSIS — D631 Anemia in chronic kidney disease: Secondary | ICD-10-CM

## 2024-05-18 MED ORDER — IRON SUCROSE 200 MG IVPB - SIMPLE MED
200.0000 mg | Freq: Once | Status: AC
  Administered 2024-05-18: 13:00:00 200 mg via INTRAVENOUS
  Filled 2024-05-18: qty 110

## 2024-05-18 NOTE — Progress Notes (Signed)
 Diagnosis: Iron  Deficiency Anemia  Provider:  Praveen Mannam MD  Procedure: IV Infusion  IV Type: Peripheral, IV Location: L Hand  Venofer  (Iron  Sucrose), Dose: 200 mg  Infusion Start Time: 1320  Infusion Stop Time: 1340  Post Infusion IV Care: Observation period completed and Peripheral IV Discontinued  Discharge: Condition: Good, Destination: Home . AVS Provided  Performed by:  Doni Bacha, RN

## 2024-05-22 ENCOUNTER — Ambulatory Visit

## 2024-05-22 MED ORDER — IRON SUCROSE 200 MG IVPB - SIMPLE MED: 200.0000 mg | Freq: Once | Status: DC

## 2024-05-22 NOTE — Telephone Encounter (Signed)
 BCBS is inactive, patient was getting his last Venofer  infusion today.  I called the patient and confirmed that he is uninsured at this time. He wants to call me back with new insurance information when he receives it.  His Venofer  appointment for today has been cancelled.

## 2024-05-22 NOTE — Progress Notes (Signed)
 Insurance changed. Had to cancel appt

## 2024-05-23 ENCOUNTER — Ambulatory Visit (HOSPITAL_BASED_OUTPATIENT_CLINIC_OR_DEPARTMENT_OTHER): Admitting: Family

## 2024-05-23 NOTE — Progress Notes (Deleted)
" °  Cardiology Office Note   Date:  05/23/2024  ID:  Kenneth Hart, DOB 10-18-1965, MRN [id number] PCP: Freddrick Johns  [facility] Providers Cardiologist:  Annabella Scarce, MD { Click to update primary MD,subspecialty MD or APP then REFRESH:1}    History of Present Illness Kenneth Hart is a 58 y.o. male with hx of HTN, traumatic right hand amputation, anemia of chronic disease, iron  deficiency, prolonged QT interval, HFmrEF, hypocalcemia, CKD5  Admitted 11/10-11/15/25 after presenting with creatinine 5.64 after labs collected by PCP (had not had lab work in 10 years). CKD attributed to uncontrolled, untreated hypertension. Required IV Lasix . New onset HFmrEF with echo 04/15/24 LVEF 40-45%, gr3dd, mildly reduced RVSF, RV moderately enlarged, trivial MR. LHC deferred due to renal function. Lexiscan  myoview  04/14/24 with evidence of infarction, medical management recommended. Runs of NSVT noted, metoprolol  initiated. Discharge regimen included Amlodipine  2.5mg  daily, aspirin  81mg  daily, furosemide  40mg  BID, metoprolol  succiante 50mg  daily.   The 10-year ASCVD risk score (Arnett DK, et al., 2019) is: 25.1%. ***  ROS: Please see the history of present illness.    All other systems reviewed and are negative.   Studies Reviewed      *** Risk Assessment/Calculations   No BP recorded.  {Refresh Note OR Click here to enter BP  :1}***       Physical Exam VS:  There were no vitals taken for this visit.       Wt Readings from Last 3 Encounters:  05/18/24 236 lb (107 kg)  05/12/24 236 lb 6.4 oz (107.2 kg)  04/15/24 236 lb 12.4 oz (107.4 kg)    GEN: Well nourished, well developed in no acute distress NECK: No JVD; No carotid bruits CARDIAC: ***RRR, no murmurs, rubs, gallops RESPIRATORY:  Clear to auscultation without rales, wheezing or rhonchi  ABDOMEN: Soft, non-tender, non-distended EXTREMITIES:  No edema; No deformity   ASSESSMENT AND PLAN  CKD 5 -   HFmrEF -   Presumed  CAD *** -   Anemia -     {Are you ordering a CV Procedure (e.g. stress test, cath, DCCV, TEE, etc)?   Press F2        :[id number]}  Dispo: ***  Signed, Reche GORMAN Finder, NP   "

## 2024-05-29 ENCOUNTER — Encounter: Payer: Self-pay | Admitting: Nephrology

## 2024-06-05 ENCOUNTER — Ambulatory Visit (HOSPITAL_BASED_OUTPATIENT_CLINIC_OR_DEPARTMENT_OTHER): Admitting: Family Medicine

## 2024-06-27 ENCOUNTER — Ambulatory Visit (HOSPITAL_BASED_OUTPATIENT_CLINIC_OR_DEPARTMENT_OTHER): Admitting: Family Medicine
